# Patient Record
Sex: Female | Born: 1970 | Race: Black or African American | Hispanic: No | Marital: Single | Smoking: Former smoker
Health system: Southern US, Community
[De-identification: ages and names within clinical notes are randomized; demographics above are authoritative.]

## PROBLEM LIST (undated history)

## (undated) DIAGNOSIS — D649 Anemia, unspecified: Secondary | ICD-10-CM

## (undated) DIAGNOSIS — E039 Hypothyroidism, unspecified: Secondary | ICD-10-CM

## (undated) HISTORY — PX: FOOT SURGERY: SHX648

---

## 2017-09-01 ENCOUNTER — Emergency Department (HOSPITAL_COMMUNITY)
Admission: EM | Admit: 2017-09-01 | Discharge: 2017-09-02 | Disposition: A | Discharge status: Home / Self Care | Payer: Self-pay | Attending: Emergency Medicine | Admitting: Emergency Medicine

## 2017-09-01 ENCOUNTER — Emergency Department (HOSPITAL_COMMUNITY): Payer: Self-pay

## 2017-09-01 ENCOUNTER — Encounter (HOSPITAL_COMMUNITY): Payer: Self-pay | Admitting: Nurse Practitioner

## 2017-09-01 DIAGNOSIS — W19XXXA Unspecified fall, initial encounter: Secondary | ICD-10-CM

## 2017-09-01 DIAGNOSIS — W1839XA Other fall on same level, initial encounter: Secondary | ICD-10-CM | POA: Insufficient documentation

## 2017-09-01 DIAGNOSIS — Y999 Unspecified external cause status: Secondary | ICD-10-CM | POA: Insufficient documentation

## 2017-09-01 DIAGNOSIS — Y92012 Bathroom of single-family (private) house as the place of occurrence of the external cause: Secondary | ICD-10-CM | POA: Insufficient documentation

## 2017-09-01 DIAGNOSIS — M25532 Pain in left wrist: Secondary | ICD-10-CM | POA: Insufficient documentation

## 2017-09-01 DIAGNOSIS — Y9389 Activity, other specified: Secondary | ICD-10-CM | POA: Insufficient documentation

## 2017-09-01 NOTE — ED Provider Notes (Signed)
[facility]-EMERGENCY DEPT Provider Note   CSN: [id number] Arrival date & time: 09/01/17  2138     History   Chief Complaint Chief Complaint  Patient presents with  . Wrist Pain  . Fall    HPI April Hansen is a 47 y.o. female with a hx of no major medical problems presents to the Emergency Department complaining of acute, persistent left wrist pain after fall several hours ago.  Patient reports that she stumbled in the bathroom and fell with an outstretched hand landing on her left wrist.  Patient states that she has had 32 ounces of wine tonight.  She reports significant worsening of pain with movement and palpation.  No treatments prior to arrival.  Nothing seems to make her symptoms better.  Patient denies numbness, tingling, weakness but does endorse decreased range of motion.  Patient is adamant that she did not hit her head or have a loss of consciousness.  She has no pain anywhere else.  Significant other at bedside confirms that there was no loss of consciousness.   The history is provided by the patient, a significant other and medical records. No language interpreter was used.    History reviewed. No pertinent past medical history.  There are no active problems to display for this patient.   History reviewed. No pertinent surgical history.   OB History   None      Home Medications    Prior to Admission medications   Not on File    Family History History reviewed. No pertinent family history.  Social History Social History   Tobacco Use  . Smoking status: Unknown If Ever Smoked  Substance Use Topics  . Alcohol use: Yes  . Drug use: Not Currently     Allergies   Patient has no known allergies.   Review of Systems Review of Systems  Constitutional: Negative for chills and fever.  Gastrointestinal: Negative for nausea and vomiting.  Musculoskeletal: Positive for arthralgias and joint swelling. Negative for back pain, neck  pain and neck stiffness.  Skin: Negative for wound.  Neurological: Negative for numbness.  Hematological: Does not bruise/bleed easily.  Psychiatric/Behavioral: The patient is not nervous/anxious.   All other systems reviewed and are negative.    Physical Exam Updated Vital Signs BP 111/86 (BP Location: Right Arm)   Pulse 88   Temp 97.7 F (36.5 C) (Oral)   Resp 20   LMP 08/09/2017   SpO2 100%   Physical Exam  Constitutional: She appears well-developed and well-nourished. No distress.  HENT:  Head: Normocephalic and atraumatic.  Eyes: Conjunctivae are normal.  Neck: Normal range of motion.  Cardiovascular: Normal rate, regular rhythm and intact distal pulses.  Capillary refill < 3 sec  Pulmonary/Chest: Effort normal and breath sounds normal.  Musculoskeletal: She exhibits tenderness. She exhibits no edema.  Left hand and wrist: Decreased range of motion of the left wrist, full range of motion of all fingers of the left hand.  Strength 4/5 with grip strength.  3/5 with flexion and extension of the wrist due to severe pain.  Tenderness to palpation along the lateral portion of the left wrist.  No tenderness to the snuffbox or thenar eminence.  Mild edema and ecchymosis noted to the left wrist.  Sensation intact to normal touch throughout the entire left upper extremity. Full range of motion without pain of the left elbow and shoulder.  No joint line tenderness of the shoulder or elbow.  Neurological: She is alert.  Coordination normal.  Skin: Skin is warm and dry. She is not diaphoretic.  No tenting of the skin  Psychiatric: She has a normal mood and affect.  Nursing note and vitals reviewed.    ED Treatments / Results   Radiology Dg Wrist Complete Left  Result Date: 09/01/2017 CLINICAL DATA:  48 year old female with fall and left wrist pain. EXAM: LEFT WRIST - COMPLETE 3+ VIEW COMPARISON:  None. FINDINGS: There is no acute fracture or dislocation. The bones are mildly  osteopenic. The soft tissues appear unremarkable. IMPRESSION: Negative. Electronically Signed   By: Elgie Collard M.D.   On: 09/01/2017 22:50    Procedures Procedures (including critical care time)  Medications Ordered in ED Medications - No data to display   Initial Impression / Assessment and Plan / ED Course  I have reviewed the triage vital signs and the nursing notes.  Pertinent labs & imaging results that were available during my care of the patient were reviewed by me and considered in my medical decision making (see chart for details).  Clinical Course as of Sep 02 2326  Wynelle Link Sep 01, 2017  2320 Patient states she does not want any pain medication.   [HM]  2321 Personally evaluated the images.  No obvious fracture.  DG Wrist Complete Left [HM]    Clinical Course User Index [HM] Shuntel Fishburn, Dahlia Client, PA-C    Patient X-Ray negative for obvious fracture or dislocation. Pain managed in ED. Pt advised to follow up with orthopedics if symptoms persist for possibility of missed fracture diagnosis. Patient given brace while in ED, conservative therapy recommended and discussed. Patient will be dc home & is agreeable with above plan.   Final Clinical Impressions(s) / ED Diagnoses   Final diagnoses:  Fall, initial encounter  Left wrist pain    ED Discharge Orders    None       Riccardo Holeman, Boyd Kerbs 09/01/17 2328    Melene Plan, DO 09/03/17 0700

## 2017-09-01 NOTE — ED Triage Notes (Signed)
Pt is c/o left wrist pain/injury which she also reports was from an alcohol induced fall, states she had 32 oz wine tonight. Obvious mild deformity noted, with decreased ROM.

## 2017-09-01 NOTE — Discharge Instructions (Addendum)
1. Medications: alternate naprosyn and tylenol for pain control (do not mix these with alcohol), usual home medications 2. Treatment: rest, ice, elevate and use brace, drink plenty of fluids, gentle stretching 3. Follow Up: Please followup with orthopedics as directed or your PCP in 1 week if no improvement for discussion of your diagnoses and further evaluation after today's visit; if you do not have a primary care doctor use the resource guide provided to find one; Please return to the ER for worsening symptoms or other concerns

## 2018-08-05 DIAGNOSIS — R0781 Pleurodynia: Secondary | ICD-10-CM | POA: Diagnosis not present

## 2018-08-05 DIAGNOSIS — M62838 Other muscle spasm: Secondary | ICD-10-CM | POA: Diagnosis not present

## 2018-08-05 DIAGNOSIS — R071 Chest pain on breathing: Secondary | ICD-10-CM | POA: Diagnosis not present

## 2018-08-06 ENCOUNTER — Ambulatory Visit
Admission: RE | Admit: 2018-08-06 | Discharge: 2018-08-06 | Disposition: A | Discharge status: Home / Self Care | Payer: BLUE CROSS/BLUE SHIELD | Source: Ambulatory Visit | Attending: Family Medicine | Admitting: Family Medicine

## 2018-08-06 ENCOUNTER — Other Ambulatory Visit: Payer: Self-pay | Admitting: Family Medicine

## 2018-08-06 DIAGNOSIS — R071 Chest pain on breathing: Secondary | ICD-10-CM

## 2018-08-06 DIAGNOSIS — R0781 Pleurodynia: Secondary | ICD-10-CM

## 2018-08-06 DIAGNOSIS — R0789 Other chest pain: Secondary | ICD-10-CM | POA: Diagnosis not present

## 2018-08-07 DIAGNOSIS — M62838 Other muscle spasm: Secondary | ICD-10-CM | POA: Diagnosis not present

## 2018-08-07 DIAGNOSIS — R071 Chest pain on breathing: Secondary | ICD-10-CM | POA: Diagnosis not present

## 2018-08-07 DIAGNOSIS — M549 Dorsalgia, unspecified: Secondary | ICD-10-CM | POA: Diagnosis not present

## 2018-08-08 ENCOUNTER — Emergency Department (HOSPITAL_COMMUNITY): Payer: BLUE CROSS/BLUE SHIELD

## 2018-08-08 ENCOUNTER — Other Ambulatory Visit: Payer: Self-pay

## 2018-08-08 ENCOUNTER — Emergency Department (HOSPITAL_COMMUNITY)
Admission: EM | Admit: 2018-08-08 | Discharge: 2018-08-08 | Disposition: A | Discharge status: Home / Self Care | Payer: BLUE CROSS/BLUE SHIELD | Attending: Emergency Medicine | Admitting: Emergency Medicine

## 2018-08-08 ENCOUNTER — Encounter (HOSPITAL_COMMUNITY): Payer: Self-pay

## 2018-08-08 DIAGNOSIS — R079 Chest pain, unspecified: Secondary | ICD-10-CM | POA: Diagnosis not present

## 2018-08-08 DIAGNOSIS — Z79899 Other long term (current) drug therapy: Secondary | ICD-10-CM | POA: Insufficient documentation

## 2018-08-08 DIAGNOSIS — F17210 Nicotine dependence, cigarettes, uncomplicated: Secondary | ICD-10-CM | POA: Insufficient documentation

## 2018-08-08 DIAGNOSIS — R0789 Other chest pain: Secondary | ICD-10-CM

## 2018-08-08 LAB — COMPREHENSIVE METABOLIC PANEL
ALBUMIN: 3.9 g/dL (ref 3.5–5.0)
ALT: 10 U/L (ref 0–44)
ANION GAP: 11 (ref 5–15)
AST: 20 U/L (ref 15–41)
Alkaline Phosphatase: 76 U/L (ref 38–126)
BUN: 7 mg/dL (ref 6–20)
CHLORIDE: 104 mmol/L (ref 98–111)
CO2: 24 mmol/L (ref 22–32)
Calcium: 8.7 mg/dL — ABNORMAL LOW (ref 8.9–10.3)
Creatinine, Ser: 0.65 mg/dL (ref 0.44–1.00)
GFR calc Af Amer: >60 mL/min (ref >60)
GFR calc non Af Amer: >60 mL/min (ref >60)
GLUCOSE: 98 mg/dL (ref 70–99)
POTASSIUM: 3.5 mmol/L (ref 3.5–5.1)
Sodium: 139 mmol/L (ref 135–145)
TOTAL PROTEIN: 7.3 g/dL (ref 6.5–8.1)
Total Bilirubin: 0.7 mg/dL (ref 0.3–1.2)

## 2018-08-08 LAB — I-STAT BETA HCG BLOOD, ED (MC, WL, AP ONLY)

## 2018-08-08 LAB — CBC WITH DIFFERENTIAL/PLATELET
Abs Immature Granulocytes: 0.01 10*3/uL (ref 0.00–0.07)
BASOS ABS: 0.1 10*3/uL (ref 0.0–0.1)
BASOS PCT: 1 %
EOS ABS: 0.1 10*3/uL (ref 0.0–0.5)
EOS PCT: 2 %
HEMATOCRIT: 41.1 % (ref 36.0–46.0)
Hemoglobin: 13 g/dL (ref 12.0–15.0)
IMMATURE GRANULOCYTES: 0 %
LYMPHS ABS: 1.8 10*3/uL (ref 0.7–4.0)
Lymphocytes Relative: 46 %
MCH: 29 pg (ref 26.0–34.0)
MCHC: 31.6 g/dL (ref 30.0–36.0)
MCV: 91.5 fL (ref 80.0–100.0)
Monocytes Absolute: 0.4 10*3/uL (ref 0.1–1.0)
Monocytes Relative: 10 %
NEUTROS PCT: 41 %
Neutro Abs: 1.6 10*3/uL — ABNORMAL LOW (ref 1.7–7.7)
PLATELETS: 305 10*3/uL (ref 150–400)
RBC: 4.49 MIL/uL (ref 3.87–5.11)
RDW: 18.3 % — AB (ref 11.5–15.5)
WBC: 3.9 10*3/uL — AB (ref 4.0–10.5)
nRBC: 0 % (ref 0.0–0.2)

## 2018-08-08 LAB — I-STAT TROPONIN, ED: TROPONIN I, POC: 0 ng/mL (ref 0.00–0.08)

## 2018-08-08 LAB — D-DIMER, QUANTITATIVE (NOT AT ARMC): D DIMER QUANT: 0.32 ug{FEU}/mL (ref 0.00–0.50)

## 2018-08-08 MED ORDER — NAPROXEN 500 MG PO TABS: 500.0000 mg | ORAL_TABLET | Freq: Two times a day (BID) | ORAL | 0 refills | Status: DC

## 2018-08-08 MED ORDER — METHOCARBAMOL 500 MG PO TABS: 500.0000 mg | ORAL_TABLET | Freq: Two times a day (BID) | ORAL | 0 refills | Status: DC

## 2018-08-08 MED ORDER — KETOROLAC TROMETHAMINE 30 MG/ML IJ SOLN
30.0000 mg | Freq: Once | INTRAMUSCULAR | Status: AC
  Administered 2018-08-08: 30 mg via INTRAVENOUS
  Filled 2018-08-08: qty 1

## 2018-08-08 NOTE — ED Notes (Signed)
ED Provider at bedside. 

## 2018-08-08 NOTE — ED Triage Notes (Signed)
Patient states she fell asleep on the couch 2 days ago and when she woke  she had right chest pain that radiates into the right rib cage. Patient states she knows she did not fall off of the couch beause she was still lying on the couch this AM. Patient states it hurts to take a deep breath, laugh, or cough. Patient states she had an x-ray done 2 days ago and was told it was negative.

## 2018-08-08 NOTE — Discharge Instructions (Signed)
Your evaluated today for chest wall pain.  Your work-up was negative in department.  I have given you Robaxin and naproxen.  Please take as prescribed.  Please not drive, operate heavy machinery, make life or that decisions while taking these medications.  Follow up with primary care provider next week if you continue to have symptoms.  Return to the ED for any worsening symptoms.

## 2018-08-08 NOTE — ED Provider Notes (Signed)
[facility]-EMERGENCY DEPT Provider Note   CSN: [id number] Arrival date & time: 08/08/18  0946  History   Chief Complaint Chief Complaint  Patient presents with  . chest wall pain  . Chest Pain    HPI April Hansen is a 48 y.o. female with no significant past medical history who presents for evaluation of chest pain.  Patient states she fell asleep on the couch on her right side and then woke up with severe right chest pain 2 days ago.  Pain is located to right chest, right ribs and wraps around under arm into right lateral chest.  Pain is rated 10/10.  Patient states she is pleuritic chest pain, her pain is worse with coughing, laughing, movement or palpating area.  Patient was seen by PCP 2 days ago and had a negative chest x-ray.  Patient states that her PCP told her she needs a chest CT, however it would take "a longtime to get insurance approval." Was given Flexeril and naproxen, however patient states "my husband looked at the side effects and told me not to take it."  Pain is not exertional in nature. No substernal chest pain, associated lightheadedness, dizziness, diaphoresis, nausea, vomiting.  Pain does not radiate into her arms or jaw.  No pain to neck or bilateral shoulders.  Has been taking "herbs" for her pain. Denies additional aggravating or alleviating factors.  History obtained from patient.  No interpreter was used.     HPI  History reviewed. No pertinent past medical history.  There are no active problems to display for this patient.   Past Surgical History:  Procedure Laterality Date  . CESAREAN SECTION    . FOOT SURGERY       OB History   No obstetric history on file.      Home Medications    Prior to Admission medications   Medication Sig Start Date End Date Taking? Authorizing Provider  naproxen sodium (ALEVE) 220 MG tablet Take 220 mg by mouth 2 (two) times daily as needed (pain).   Yes [provider]    methocarbamol (ROBAXIN) 500 MG tablet Take 1 tablet (500 mg total) by mouth 2 (two) times daily. 08/08/18   Zekiah Coen A, PA-C  naproxen (NAPROSYN) 500 MG tablet Take 1 tablet (500 mg total) by mouth 2 (two) times daily. 08/08/18   Bryndan Bilyk A, PA-C    Family History Family History  Problem Relation Age of Onset  . Cancer Mother     Social History Social History   Tobacco Use  . Smoking status: Current Every Day Smoker    Packs/day: 0.10    Types: Cigarettes  . Smokeless tobacco: Never Used  Substance Use Topics  . Alcohol use: Yes  . Drug use: Not Currently     Allergies   Pineapple   Review of Systems Review of Systems  Constitutional: Negative.   HENT: Negative.   Eyes: Negative.   Respiratory: Negative.   Cardiovascular: Positive for chest pain. Negative for palpitations and leg swelling.  Gastrointestinal: Negative.   Genitourinary: Negative.   Musculoskeletal: Negative.   Skin: Negative.   Neurological: Negative.   All other systems reviewed and are negative.    Physical Exam Updated Vital Signs BP (!) 143/97   Pulse 65   Temp 97.9 F (36.6 C) (Oral)   Resp 16   Ht 5\' 8"  (1.727 m)   Wt 73.5 kg   LMP 02/05/2018   SpO2 97%   BMI 24.63  kg/m   Physical Exam Vitals signs and nursing note reviewed. Exam conducted with a chaperone present.  Constitutional:      General: She is not in acute distress.    Appearance: She is well-developed.  HENT:     Head: Atraumatic.  Eyes:     Pupils: Pupils are equal, round, and reactive to light.  Neck:     Musculoskeletal: Normal range of motion.     Comments: No midline cervical tenderness palpation.  No JVD.  Full range of motion without difficulty.  No neck stiffness or neck rigidity.  No meningismus. Cardiovascular:     Rate and Rhythm: Normal rate.     Heart sounds: Normal heart sounds.     Comments: No carotid bruits. Pulmonary:     Effort: No respiratory distress.     Comments: Clear to  auscultation bilateral without wheeze, rhonchi rales.  No accessory muscle usage.  Able speak in full sentences. Chest:       Comments: Right-sided chest wall tenderness to palpation.  No crepitus, edema.  No obvious deformities or lacerations. Abdominal:     General: There is no distension.     Comments: Soft, nontender without rebound or guarding.  No CVA tenderness.  Normoactive bowel sounds.  Musculoskeletal: Normal range of motion.     Right shoulder: Normal.     Left shoulder: Normal.     Right elbow: Normal.    Cervical back: Normal.     Thoracic back: Normal.     Lumbar back: Normal.       Back:     Comments: No midline thoracic or lumbar tenderness palpation.  Full range of motion, however with pain with twisting or bending to the thoracic spine.  Full range of motion bilateral upper extremities without difficulty.  Skin:    General: Skin is warm and dry.     Comments: No rashes or lesions.  Neurological:     Mental Status: She is alert.     ED Treatments / Results  Labs (all labs ordered are listed, but only abnormal results are displayed) Labs Reviewed  CBC WITH DIFFERENTIAL/PLATELET - Abnormal; Notable for the following components:      Result Value   WBC 3.9 (*)    RDW 18.3 (*)    Neutro Abs 1.6 (*)    All other components within normal limits  COMPREHENSIVE METABOLIC PANEL - Abnormal; Notable for the following components:   Calcium 8.7 (*)    All other components within normal limits  D-DIMER, QUANTITATIVE (NOT AT [facility])  I-STAT TROPONIN, ED  I-STAT BETA HCG BLOOD, ED ([facility], [facility], [facility] ONLY)    EKG None  EKG Interpretation  Date/Time:    Ventricular Rate:   79 PR Interval:    QRS Duration:   QT Interval:    QTC Calculation:   R Axis:     Text Interpretation:  Normal sinus rhythm      Radiology Dg Chest 2 View  Result Date: 08/08/2018 CLINICAL DATA:  Right-sided chest and chest wall pain beginning 2 days ago. EXAM: CHEST - 2 VIEW COMPARISON:   Radiography 2 days ago FINDINGS: Artifact overlies the chest. Heart size is normal. Mediastinal shadows are normal. Incidental azygos fissure. The pulmonary vascularity is normal. The lungs are clear. No bone abnormality. No effusions. IMPRESSION: No active cardiopulmonary disease. Electronically Signed   By: Paulina Fusi M.D.   On: 08/08/2018 10:54    Procedures Procedures (including critical care time)  Medications Ordered  in ED Medications  ketorolac (TORADOL) 30 MG/ML injection 30 mg (30 mg Intravenous Given 08/08/18 1230)     Initial Impression / Assessment and Plan / ED Course  I have reviewed the triage vital signs and the nursing notes.  Pertinent labs & imaging results that were available during my care of the patient were reviewed by me and considered in my medical decision making (see chart for details).  48 year old female presents for evaluation of chest wall pain.  Located to right side of chest.  Afebrile, nonseptic, non-ill-appearing.  Patient was seen by PCP 2 days ago for similar complaints and had negative chest x-ray.  Was given Flexeril and ibuprofen, however patient did not want to start these medications as she thought it may be sedating.  Chest pain not exertional or pleuritic in nature.  No associated lightheadedness, dizziness, hemoptysis, diaphoresis, radiation into arm or jaw, nausea or vomiting.  No history of hypertension, hyperlipidemia, diabetes, family history of early MI.  No lower extremity edema, erythema or warmth.  No tachycardia, tachypnea or hypoxia, no exogenous hormone use, recent surgeries or recent immobilization, low suspicion for PE, PERC, Wells negative.  Heart score 0, CBC with WBC at 3.9, Metabolic panel without renal, liver, electrolyte abnormality, troponin negative, d-dimer negative, hCG negative, chest x-ray without evidence of acute cardiopulmonary pathology, EKG without ischemic changes, normal sinus rhythm.  Given patient has bad pain x2 days do  not feel additional troponin necessary at this time.  Patient states PCP sent her over here for a CT scan.  Discussed with patient and husband in room.  Patient and husband state they do not want CT scan at this time.  Discussed risk versus benefit.  They both voiced understanding and declined additional imaging at this time.  Chest wall tenderness to right side and right lateral chest.  No overlying rashes, erythema, warmth, no induration or fluctuance to suggest infectious pathology or shinges.  No crepitus or deformity.  Will DC home with Robaxin and naproxen.  Low suspicion for ACS, PE, dissection, acute rib fracture, pneumothorax, lung symptoms.  Full range of motion bilateral upper extremities, no midline cervical pathology.  Patient hemodynamically stable and appropriate for DC home at this time.  Patient is to be discharged with recommendation to follow up with PCP in regards to today's hospital visit. Chest pain is not likely of cardiac or pulmonary etiology d/t presentation, PERC negative, VSS, no tracheal deviation, no JVD or new murmur, RRR, breath sounds equal bilaterally, EKG without acute abnormalities, negative troponin, and negative CXR. Pt has been advised to return to the ED if CP becomes exertional, associated with diaphoresis or nausea, radiates to left jaw/arm, worsens or becomes concerning in any way. Pt appears reliable for follow up and is agreeable to discharge.      Final Clinical Impressions(s) / ED Diagnoses   Final diagnoses:  Chest wall pain    ED Discharge Orders         Ordered    methocarbamol (ROBAXIN) 500 MG tablet  2 times daily     08/08/18 1303    naproxen (NAPROSYN) 500 MG tablet  2 times daily     08/08/18 1303           Rukiya Hodgkins A, PA-C 08/08/18 1543    Azalia Bilis, MD 08/08/18 [phone]

## 2018-08-22 ENCOUNTER — Encounter: Payer: Self-pay | Admitting: Obstetrics and Gynecology

## 2018-09-03 ENCOUNTER — Ambulatory Visit: Payer: BLUE CROSS/BLUE SHIELD | Admitting: Obstetrics and Gynecology

## 2019-01-22 ENCOUNTER — Telehealth: Payer: Self-pay | Admitting: General Practice

## 2019-01-22 NOTE — Telephone Encounter (Signed)
Patient is calling to see if Dr. Edilia Bo would take her on as a new patient. Patient was reffered by Phillis Haggis. Please advise Cb- [phone]

## 2019-01-22 NOTE — Telephone Encounter (Signed)
ok 

## 2019-01-23 ENCOUNTER — Other Ambulatory Visit: Payer: Self-pay

## 2019-01-23 NOTE — Telephone Encounter (Signed)
Could you please schedule this patient for new patient appointment to see Dr. Lorelei Pont at her convenience.

## 2019-01-27 ENCOUNTER — Other Ambulatory Visit: Payer: Self-pay

## 2019-01-27 NOTE — Progress Notes (Addendum)
[facility] [address], [address] [place], [place] [address] [phone] [phone]  Date:  01/28/2019   Name:  April ChyleDeatrea Hansen   DOB:  28-Mar-1971   MRN:  [id number]  PCP:  Pearline Cablesopland,  C, MD    Chief Complaint: New Patient (Initial Visit) and Back Pain (2 weeks, sciatica)   History of Present Illness:  April Hansen is a 48 y.o. very pleasant female patient who presents with the following:  "De-trea"  Here today as a new patient to establish care She moved here from [place] about 2 years ago- moved to [place] because she wanted a change  She works at [employer]  She has 3 daughters and a grandson- just turned 416 yo  Her daughters are [phone], [phone], and [phone]- they all live in [place]  She does tend to get migraine headache She had foot surgery bilaterally as a young teen- it sounds like bunions and bunionettes were corrected   Her main concern today is sciatica She has had this several times in the past- first noted maybe 7 years ago Always on the left side  This time she has noted pain for about 2 weeks She does lift on a regular basis at work- nothing unusual recently  She walks on a hard floor at work all day long  Never had any back surgery She did have x-rays of her back a few years ago- ok per her recollection  She has used po steroids and did an epidural steroid injection in the past, as well as tramadol for pain No bowel or bladder incont or saddle anesthesia  She has tried ibuprofen and some herbs that were suggested by her relative FreedomSean, also my pt.  unfortunately did not really help   No recent labs, would like to do today  Last mammo a couple of years ago   She has had migraine headache for several years- since she was a teen  Can occur up to once or twice a week - frequency will vary exposure to bright light may increase frequency  She may get aura some of the time  She may vomit with headache She has photo and phono  sensitivity   Never seen neurology for this issue Never tried a triptan  Has so far just used OTC meds prn   Per pt she has had BTL Patient Active Problem List   Diagnosis Date Noted  . Left sided sciatica 01/28/2019  . Migraine with aura and without status migrainosus, not intractable 01/28/2019    No past medical history on file.  Past Surgical History:  Procedure Laterality Date  . CESAREAN SECTION    . FOOT SURGERY      Social History   Tobacco Use  . Smoking status: Current Every Day Smoker    Packs/day: 0.10    Types: Cigarettes  . Smokeless tobacco: Never Used  Substance Use Topics  . Alcohol use: Yes  . Drug use: Not Currently    Family History  Problem Relation Age of Onset  . Cancer Mother     Allergies  Allergen Reactions  . Pineapple Swelling    Medication list has been reviewed and updated.  Current Outpatient Medications on File Prior to Visit  Medication Sig Dispense Refill  . Biotin w/ Vitamins C & E (HAIR/SKIN/NAILS PO) Take by mouth.    . Calcium Carbonate-Vit D-Min (CALCIUM 1200 PO) Take by mouth.    . Multiple Vitamin (MULTIVITAMIN)  tablet Take 1 tablet by mouth daily.     No current facility-administered medications on file prior to visit.     Review of Systems:  As per HPI- otherwise negative.  No fever or chills No CP or SOB No rash  Physical Examination: Vitals:   01/28/19 1028  BP: 120/76  Pulse: 80  Resp: 16  Temp: (!) 97.1 F (36.2 C)  SpO2: 97%   Vitals:   01/28/19 1028  Weight: 156 lb (70.8 kg)  Height: 5\' 8"  (1.727 m)   Body mass index is 23.72 kg/m. Ideal Body Weight: Weight in (lb) to have BMI = 25: 164.1  GEN: WDWN, NAD, Non-toxic, A & O x 3, normal weight, looks well  HEENT: Atraumatic, Normocephalic. Neck supple. No masses, No LAD.  PEERL, TM wnl bilaterally  Ears and Nose: No external deformity. CV: RRR, No M/G/R. No JVD. No thrill. No extra heart sounds. PULM: CTA B, no wheezes, crackles, rhonchi.  No retractions. No resp. distress. No accessory muscle use. ABD: S, NT, ND EXTR: No c/c/e NEURO Normal gait.  PSYCH: Normally interactive. Conversant. Not depressed or anxious appearing.  Calm demeanor.  normal TL ROM, tenderness in left sciatic notch only Normal BLE strength, rom, reflexes   She does note tingling and pain running down the lateral and posterior left leg   Assessment and Plan:   ICD-10-CM   1. Left sided sciatica  M54.32 predniSONE (DELTASONE) 20 MG tablet    traMADol (ULTRAM) 50 MG tablet  2. Migraine with aura and without status migrainosus, not intractable  G43.109 SUMAtriptan (IMITREX) 100 MG tablet  3. Screening for hyperlipidemia  Z13.220 Lipid panel  4. Screening for diabetes mellitus  Z13.1 Comprehensive metabolic panel    Hemoglobin A1c  5. Screening for deficiency anemia  Z13.0 CBC  6. History of thyroid disease  Z86.39 TSH  7. Screening for breast cancer  Z12.39 MM 3D SCREEN BREAST BILATERAL   Screening labs as above Discussed back films, she prefers to defer for now  Ordered mammo------------------------------------------- It was great to meet you today- I will be in touch with your labs For your back, let's try prednisone (oral steroid) for 10 days While on prednisone avoid NSAID meds like ibuprofen, tylenol is ok  I also gave you tramadol to use for uncontrolled pain- use this only if necessary as it is a mild narcotic  For migraine, try Imitrex at the first sign of a headache. Please let me know how this works for you I ordered a screening mammo that can be done on the first floor imaging dept at your convenience  Take care and keep me posted    Follow-up: No follow-ups on file.  Meds ordered this encounter  Medications  . predniSONE (DELTASONE) 20 MG tablet    Sig: Take 2 pills daily for 5 days, then 1 pill daily for 5 days    Dispense:  15 tablet    Refill:  0  . SUMAtriptan (IMITREX) 100 MG tablet    Sig: Take 1/2 or 1 tablet po prn  migraine. May repeat in 2 hours if headache persists or recurs. Max 200 mg in 24 hours    Dispense:  10 tablet    Refill:  0  . traMADol (ULTRAM) 50 MG tablet    Sig: Take 1 tablet (50 mg total) by mouth every 8 (eight) hours as needed for up to 5 days.    Dispense:  15 tablet    Refill:  0  Orders Placed This Encounter  Procedures  . MM 3D SCREEN BREAST BILATERAL  . CBC  . Comprehensive metabolic panel  . Hemoglobin A1c  . Lipid panel  . TSH    @SIGN @    Signed Abbe AmsterdamJessica , MD  Received her labs, letter to pt  Results for orders placed or performed in visit on 01/28/19  CBC  Result Value Ref Range   WBC 5.7 4.0 - 10.5 K/uL   RBC 3.67 (L) 3.87 - 5.11 Mil/uL   Platelets 216.0 150.0 - 400.0 K/uL   Hemoglobin 12.0 12.0 - 15.0 g/dL   HCT 10.235.6 (L) 72.536.0 - 36.646.0 %   MCV 97.1 78.0 - 100.0 fl   MCHC 33.5 30.0 - 36.0 g/dL   RDW 44.016.4 (H) 34.711.5 - 42.515.5 %  Comprehensive metabolic panel  Result Value Ref Range   Sodium 140 135 - 145 mEq/L   Potassium 3.2 (L) 3.5 - 5.1 mEq/L   Chloride 96 96 - 112 mEq/L   CO2 35 (H) 19 - 32 mEq/L   Glucose, Bld 87 70 - 99 mg/dL   BUN 6 6 - 23 mg/dL   Creatinine, Ser 9.560.68 0.40 - 1.20 mg/dL   Total Bilirubin 0.3 0.2 - 1.2 mg/dL   Alkaline Phosphatase 52 39 - 117 U/L   AST 18 0 - 37 U/L   ALT 12 0 - 35 U/L   Total Protein 6.8 6.0 - 8.3 g/dL   Albumin 4.1 3.5 - 5.2 g/dL   Calcium 9.8 8.4 - 38.710.5 mg/dL   GFR 564.33112.69 >29.51>60.00 mL/min  Hemoglobin A1c  Result Value Ref Range   Hgb A1c MFr Bld 5.1 4.6 - 6.5 %  Lipid panel  Result Value Ref Range   Cholesterol 169 0 - 200 mg/dL   Triglycerides 884.1136.0 0.0 - 149.0 mg/dL   HDL 66.0669.60 >30.16>39.00 mg/dL   VLDL 01.027.2 0.0 - 93.240.0 mg/dL   LDL Cholesterol 72 0 - 99 mg/dL   Total CHOL/HDL Ratio 2    NonHDL 98.91   TSH  Result Value Ref Range   TSH 0.32 (L) 0.35 - 4.50 uIU/mL

## 2019-01-27 NOTE — Patient Instructions (Addendum)
It was great to meet you today- I will be in touch with your labs For your back, let's try prednisone (oral steroid) for 10 days While on prednisone avoid NSAID meds like ibuprofen, tylenol is ok  I also gave you tramadol to use for uncontrolled pain- use this only if necessary as it is a mild narcotic  For migraine, try Imitrex at the first sign of a headache. Please let me know how this works for you I ordered a screening mammo that can be done on the first floor imaging dept at your convenience  Take care and keep me posted

## 2019-01-28 ENCOUNTER — Ambulatory Visit (INDEPENDENT_AMBULATORY_CARE_PROVIDER_SITE_OTHER): Payer: BC Managed Care – PPO | Admitting: Family Medicine

## 2019-01-28 ENCOUNTER — Encounter: Payer: Self-pay | Admitting: Family Medicine

## 2019-01-28 VITALS — BP 120/76 | HR 80 | Temp 97.1°F | Resp 16 | Ht 68.0 in | Wt 156.0 lb

## 2019-01-28 DIAGNOSIS — Z13220 Encounter for screening for lipoid disorders: Secondary | ICD-10-CM

## 2019-01-28 DIAGNOSIS — Z131 Encounter for screening for diabetes mellitus: Secondary | ICD-10-CM

## 2019-01-28 DIAGNOSIS — Z8639 Personal history of other endocrine, nutritional and metabolic disease: Secondary | ICD-10-CM

## 2019-01-28 DIAGNOSIS — Z1239 Encounter for other screening for malignant neoplasm of breast: Secondary | ICD-10-CM

## 2019-01-28 DIAGNOSIS — M5432 Sciatica, left side: Secondary | ICD-10-CM | POA: Diagnosis not present

## 2019-01-28 DIAGNOSIS — R7989 Other specified abnormal findings of blood chemistry: Secondary | ICD-10-CM

## 2019-01-28 DIAGNOSIS — Z130 Encounter for screening for diseases of the blood and blood-forming organs and certain disorders involving the immune mechanism: Secondary | ICD-10-CM

## 2019-01-28 DIAGNOSIS — G43109 Migraine with aura, not intractable, without status migrainosus: Secondary | ICD-10-CM

## 2019-01-28 LAB — LIPID PANEL
Cholesterol: 169 mg/dL (ref 0–200)
HDL: 69.6 mg/dL (ref >39.00)
LDL Cholesterol: 72 mg/dL (ref 0–99)
NonHDL: 98.91
Total CHOL/HDL Ratio: 2
Triglycerides: 136 mg/dL (ref 0.0–149.0)
VLDL: 27.2 mg/dL (ref 0.0–40.0)

## 2019-01-28 LAB — COMPREHENSIVE METABOLIC PANEL
ALT: 12 U/L (ref 0–35)
AST: 18 U/L (ref 0–37)
Albumin: 4.1 g/dL (ref 3.5–5.2)
Alkaline Phosphatase: 52 U/L (ref 39–117)
BUN: 6 mg/dL (ref 6–23)
CO2: 35 mEq/L — ABNORMAL HIGH (ref 19–32)
Calcium: 9.8 mg/dL (ref 8.4–10.5)
Chloride: 96 mEq/L (ref 96–112)
Creatinine, Ser: 0.68 mg/dL (ref 0.40–1.20)
GFR: 112.69 mL/min (ref >60.00)
Glucose, Bld: 87 mg/dL (ref 70–99)
Potassium: 3.2 mEq/L — ABNORMAL LOW (ref 3.5–5.1)
Sodium: 140 mEq/L (ref 135–145)
Total Bilirubin: 0.3 mg/dL (ref 0.2–1.2)
Total Protein: 6.8 g/dL (ref 6.0–8.3)

## 2019-01-28 LAB — CBC
HCT: 35.6 % — ABNORMAL LOW (ref 36.0–46.0)
Hemoglobin: 12 g/dL (ref 12.0–15.0)
MCHC: 33.5 g/dL (ref 30.0–36.0)
MCV: 97.1 fl (ref 78.0–100.0)
Platelets: 216 10*3/uL (ref 150.0–400.0)
RBC: 3.67 Mil/uL — ABNORMAL LOW (ref 3.87–5.11)
RDW: 16.4 % — ABNORMAL HIGH (ref 11.5–15.5)
WBC: 5.7 10*3/uL (ref 4.0–10.5)

## 2019-01-28 LAB — HEMOGLOBIN A1C: Hgb A1c MFr Bld: 5.1 % (ref 4.6–6.5)

## 2019-01-28 LAB — TSH: TSH: 0.32 u[IU]/mL — ABNORMAL LOW (ref 0.35–4.50)

## 2019-01-28 MED ORDER — PREDNISONE 20 MG PO TABS: ORAL_TABLET | ORAL | 0 refills | Status: DC

## 2019-01-28 MED ORDER — TRAMADOL HCL 50 MG PO TABS: 50.0000 mg | ORAL_TABLET | Freq: Three times a day (TID) | ORAL | 0 refills | Status: AC | PRN

## 2019-01-28 MED ORDER — SUMATRIPTAN SUCCINATE 100 MG PO TABS: ORAL_TABLET | ORAL | 0 refills | Status: DC

## 2019-01-28 NOTE — Addendum Note (Signed)
Addended by: Lamar Blinks C on: 01/28/2019 06:41 PM   Modules accepted: Orders

## 2019-03-26 ENCOUNTER — Ambulatory Visit (HOSPITAL_BASED_OUTPATIENT_CLINIC_OR_DEPARTMENT_OTHER)
Admission: RE | Admit: 2019-03-26 | Discharge: 2019-03-26 | Disposition: A | Discharge status: Home / Self Care | Payer: BC Managed Care – PPO | Source: Ambulatory Visit | Attending: Family Medicine | Admitting: Family Medicine

## 2019-03-26 ENCOUNTER — Other Ambulatory Visit: Payer: Self-pay

## 2019-03-26 DIAGNOSIS — Z1231 Encounter for screening mammogram for malignant neoplasm of breast: Secondary | ICD-10-CM | POA: Diagnosis not present

## 2019-03-26 DIAGNOSIS — Z1239 Encounter for other screening for malignant neoplasm of breast: Secondary | ICD-10-CM

## 2019-04-29 ENCOUNTER — Emergency Department (HOSPITAL_COMMUNITY): Payer: BC Managed Care – PPO

## 2019-04-29 ENCOUNTER — Encounter (HOSPITAL_COMMUNITY): Payer: Self-pay

## 2019-04-29 ENCOUNTER — Emergency Department (HOSPITAL_COMMUNITY)
Admission: EM | Admit: 2019-04-29 | Discharge: 2019-04-29 | Disposition: A | Discharge status: Home / Self Care | Payer: BC Managed Care – PPO | Attending: Emergency Medicine | Admitting: Emergency Medicine

## 2019-04-29 ENCOUNTER — Other Ambulatory Visit: Payer: Self-pay

## 2019-04-29 DIAGNOSIS — W01198A Fall on same level from slipping, tripping and stumbling with subsequent striking against other object, initial encounter: Secondary | ICD-10-CM | POA: Diagnosis not present

## 2019-04-29 DIAGNOSIS — S0181XA Laceration without foreign body of other part of head, initial encounter: Secondary | ICD-10-CM | POA: Diagnosis not present

## 2019-04-29 DIAGNOSIS — E039 Hypothyroidism, unspecified: Secondary | ICD-10-CM | POA: Insufficient documentation

## 2019-04-29 DIAGNOSIS — R5381 Other malaise: Secondary | ICD-10-CM | POA: Diagnosis not present

## 2019-04-29 DIAGNOSIS — Z23 Encounter for immunization: Secondary | ICD-10-CM | POA: Insufficient documentation

## 2019-04-29 DIAGNOSIS — Y939 Activity, unspecified: Secondary | ICD-10-CM | POA: Diagnosis not present

## 2019-04-29 DIAGNOSIS — W19XXXA Unspecified fall, initial encounter: Secondary | ICD-10-CM

## 2019-04-29 DIAGNOSIS — S0993XA Unspecified injury of face, initial encounter: Secondary | ICD-10-CM | POA: Diagnosis not present

## 2019-04-29 DIAGNOSIS — S0990XA Unspecified injury of head, initial encounter: Secondary | ICD-10-CM | POA: Insufficient documentation

## 2019-04-29 DIAGNOSIS — Y999 Unspecified external cause status: Secondary | ICD-10-CM | POA: Diagnosis not present

## 2019-04-29 DIAGNOSIS — Y92019 Unspecified place in single-family (private) house as the place of occurrence of the external cause: Secondary | ICD-10-CM | POA: Diagnosis not present

## 2019-04-29 DIAGNOSIS — F17210 Nicotine dependence, cigarettes, uncomplicated: Secondary | ICD-10-CM | POA: Insufficient documentation

## 2019-04-29 HISTORY — DX: Anemia, unspecified: D64.9

## 2019-04-29 HISTORY — DX: Hypothyroidism, unspecified: E03.9

## 2019-04-29 MED ORDER — TETANUS-DIPHTH-ACELL PERTUSSIS 5-2.5-18.5 LF-MCG/0.5 IM SUSP
0.5000 mL | Freq: Once | INTRAMUSCULAR | Status: AC
  Administered 2019-04-29: 0.5 mL via INTRAMUSCULAR
  Filled 2019-04-29: qty 0.5

## 2019-04-29 MED ORDER — LIDOCAINE-EPINEPHRINE (PF) 2 %-1:200000 IJ SOLN
10.0000 mL | Freq: Once | INTRAMUSCULAR | Status: AC
  Administered 2019-04-29: 10 mL
  Filled 2019-04-29: qty 10

## 2019-04-29 NOTE — ED Provider Notes (Signed)
..  Laceration Repair  Date/Time: 04/29/2019 11:44 PM Performed by: Marianna Payment, MD Authorized by: Valarie Merino, MD   Consent:    Consent obtained:  Verbal and written   Consent given by:  Patient   Risks discussed:  Infection, pain and poor cosmetic result   Alternatives discussed:  No treatment and delayed treatment Anesthesia (see MAR for exact dosages):    Anesthesia method:  Local infiltration   Local anesthetic:  Lidocaine 2% WITH epi Laceration details:    Location:  Face   Face location:  Chin   Length (cm):  4.5 Pre-procedure details:    Preparation:  Patient was prepped and draped in usual sterile fashion Exploration:    Hemostasis achieved with:  Epinephrine   Wound exploration: wound explored through full range of motion and entire depth of wound probed and visualized     Contaminated: no   Treatment:    Area cleansed with:  Betadine   Amount of cleaning:  Standard   Irrigation solution:  Sterile saline   Irrigation volume:  10 cc   Irrigation method:  Syringe   Visualized foreign bodies/material removed: no   Mucous membrane repair:    Suture size:  5-0   Suture material:  Vicryl   Suture technique:  Simple interrupted Skin repair:    Repair method:  Sutures   Suture size:  5-0   Suture material:  Prolene   Suture technique:  Running Approximation:    Approximation:  Close Post-procedure details:    Dressing:  Non-adherent dressing   Patient tolerance of procedure:  Tolerated well, no immediate complications      Marianna Payment, MD 04/29/19 2348    Valarie Merino, MD 04/29/19 [phone]

## 2019-04-29 NOTE — ED Triage Notes (Addendum)
Patient coming from home with complaints of a fall. Patient was taking shots tonight and states that she lost her balance and hit her face on the counter. Patient believes that she lost consciousness. Denies being on blood thinners. Patient has a laceration on her chin that is still currently bleeding. Patient denies neck and back pain currently.

## 2019-04-29 NOTE — ED Notes (Signed)
Pressure dressing applied to chin.

## 2019-04-29 NOTE — ED Notes (Signed)
Patient ambulated to restroom with no assistance and steady gait. Patient has ride waiting to take her home.

## 2019-04-29 NOTE — Discharge Instructions (Signed)
Please return for any problem.  Follow-up with your regular care provider as instructed.  Sutures should be removed in 5-7 days.

## 2019-04-29 NOTE — ED Provider Notes (Signed)
[facility]-EMERGENCY DEPT Provider Note   CSN: [id number] Arrival date & time: 04/29/19  2056     History   Chief Complaint Chief Complaint  Patient presents with  . Fall  . Facial Laceration    HPI April Hansen is a 48 y.o. female.     48 year old female with prior medical history as detailed below presents for evaluation following fall.  She reports that she had had several alcoholic drinks earlier this evening.  The fall occurred while she was in the bathroom.  She lost her balance and struck her chin against the counter.  She thinks she may have knocked himself out.  She denies any injury except for a laceration to the left lower chin.  She denies dental pain.  She denies neck pain.  She denies other injury.  She is unsure of her last and a shot.    The history is provided by the patient and medical records.  Fall This is a new problem. The current episode started 1 to 2 hours ago. The problem occurs constantly. The problem has not changed since onset.Pertinent negatives include no chest pain, no abdominal pain, no headaches and no shortness of breath. Nothing aggravates the symptoms. Nothing relieves the symptoms.    Past Medical History:  Diagnosis Date  . Anemia   . Hypothyroidism     There are no active problems to display for this patient.   History reviewed. No pertinent surgical history.   OB History    Gravida  5   Para  3   Term      Preterm      AB  2   Living  3     SAB      TAB      Ectopic      Multiple      Live Births               Home Medications    Prior to Admission medications   Not on File    Family History Family History  Problem Relation Age of Onset  . Diabetes Mother   . Cancer Mother     Social History Social History   Tobacco Use  . Smoking status: Current Every Day Smoker    Packs/day: 0.50    Years: 10.00    Pack years: 5.00    Types: Cigarettes  . Smokeless tobacco:  Never Used  Substance Use Topics  . Alcohol use: Yes  . Drug use: Never     Allergies   Adhesive [tape] and Penicillins   Review of Systems Review of Systems  Respiratory: Negative for shortness of breath.   Cardiovascular: Negative for chest pain.  Gastrointestinal: Negative for abdominal pain.  Neurological: Negative for headaches.  All other systems reviewed and are negative.    Physical Exam Updated Vital Signs BP 115/81 (BP Location: Right Arm)   Pulse 76   Temp 98.2 F (36.8 C) (Oral)   Resp 16   Ht 5\' 8"  (1.727 m)   Wt 63.5 kg   SpO2 100%   BMI 21.29 kg/m   Physical Exam Vitals signs and nursing note reviewed.  Constitutional:      General: She is not in acute distress.    Appearance: Normal appearance. She is well-developed.  HENT:     Head: Normocephalic.     Comments: Approximately 3 cm laceration to the left lower aspect of the chin.  No active bleeding noted.  No  acute deformity noted of jaw.   No dental trauma noted.  Eyes:     Conjunctiva/sclera: Conjunctivae normal.     Pupils: Pupils are equal, round, and reactive to light.  Neck:     Musculoskeletal: Normal range of motion and neck supple.  Cardiovascular:     Rate and Rhythm: Normal rate and regular rhythm.     Heart sounds: Normal heart sounds.  Pulmonary:     Effort: Pulmonary effort is normal. No respiratory distress.     Breath sounds: Normal breath sounds.  Abdominal:     General: There is no distension.     Palpations: Abdomen is soft.     Tenderness: There is no abdominal tenderness.  Musculoskeletal: Normal range of motion.        General: No deformity.  Skin:    General: Skin is warm and dry.  Neurological:     Mental Status: She is alert and oriented to person, place, and time.      ED Treatments / Results  Labs (all labs ordered are listed, but only abnormal results are displayed) Labs Reviewed - No data to display  EKG None  Radiology Ct Head Wo Contrast   Result Date: 04/29/2019 CLINICAL DATA:  Head trauma secondary to a fall. The patient struck her face on a counter. EXAM: CT HEAD WITHOUT CONTRAST TECHNIQUE: Contiguous axial images were obtained from the base of the skull through the vertex without intravenous contrast. COMPARISON:  None. FINDINGS: Brain: No evidence of acute infarction, hemorrhage, hydrocephalus, extra-axial collection or mass lesion/mass effect. Vascular: No hyperdense vessel or unexpected calcification. Skull: Normal. Negative for fracture or focal lesion. Sinuses/Orbits: No acute abnormality. Old healed fracture of the medial wall of the right orbit. Other: None IMPRESSION: No significant abnormalities. Electronically Signed   By: Lorriane Shire M.D.   On: 04/29/2019 21:31    Procedures Procedures (including critical care time)  Medications Ordered in ED Medications  Tdap (BOOSTRIX) injection 0.5 mL (0.5 mLs Intramuscular Given 04/29/19 2147)  lidocaine-EPINEPHrine (XYLOCAINE W/EPI) 2 %-1:200000 (PF) injection 10 mL (10 mLs Infiltration Given by Other 04/29/19 2148)     Initial Impression / Assessment and Plan / ED Course  I have reviewed the triage vital signs and the nursing notes.  Pertinent labs & imaging results that were available during my care of the patient were reviewed by me and considered in my medical decision making (see chart for details).        MDM  Screen complete  [name] was evaluated in Emergency Department on 04/29/2019 for the symptoms described in the history of present illness. She was evaluated in the context of the global COVID-19 pandemic, which necessitated consideration that the patient might be at risk for infection with the SARS-CoV-2 virus that causes COVID-19. Institutional protocols and algorithms that pertain to the evaluation of patients at risk for COVID-19 are in a state of rapid change based on information released by regulatory bodies including the CDC and federal and  state organizations. These policies and algorithms were followed during the patient's care in the ED.  Patient is presenting for evaluation and treatment following fall.  She has a linear laceration to the left side of her chin.  Imaging studies did not reveal other intracranial injury.  She is otherwise without injury on exam.   Laceration repaired in the ED.  Patient tolerated this well.  Patient is appropriate for discharge.  Importance of close follow-up is stressed.  Strict return precautions given  and understood.  Final Clinical Impressions(s) / ED Diagnoses   Final diagnoses:  Fall, initial encounter  Chin laceration, initial encounter    ED Discharge Orders    None       Wynetta Fines, MD 04/29/19 2223

## 2019-04-30 ENCOUNTER — Encounter: Payer: Self-pay | Admitting: Family Medicine

## 2019-06-16 NOTE — Progress Notes (Signed)
[facility] [address], [address] [place], [place] [address] [phone]  Date:  06/18/2019   Name:  April Hansen   DOB:  Oct 29, 1970   MRN:  [id number]  PCP:  Pearline Cables, MD    Chief Complaint: Suture / Staple Removal   History of Present Illness:  April Hansen is a 49 y.o. very pleasant female patient who presents with the following:  Here today with concern of suture removal Last seen by myself in August She recently moved from [place] to the local area, works at [employer].  On 11/18 she had some sutures placed in her chin per the emergency department- she did not know that she was upposed to have these removed  Per ER note, it appears that she had absorbable sutures placed inside her mouth, but also nonabsorbable on the external skin She has some visible blue suture material on her external chin, her partner was worried about this so she came in to be seen She otherwise feels okay, no other concerns today  Patient Active Problem List   Diagnosis Date Noted  . Left sided sciatica 01/28/2019  . Migraine with aura and without status migrainosus, not intractable 01/28/2019    Past Medical History:  Diagnosis Date  . Anemia   . Hypothyroidism     Past Surgical History:  Procedure Laterality Date  . CESAREAN SECTION    . FOOT SURGERY      Social History   Tobacco Use  . Smoking status: Current Every Day Smoker    Packs/day: 0.50    Years: 10.00    Pack years: 5.00    Types: Cigarettes  . Smokeless tobacco: Never Used  Substance Use Topics  . Alcohol use: Yes  . Drug use: Never    Family History  Problem Relation Age of Onset  . Diabetes Mother   . Cancer Mother     Allergies  Allergen Reactions  . Adhesive [Tape] Itching  . Penicillins Itching  . Pineapple Swelling    Medication list has been reviewed and updated.  Current Outpatient Medications on File Prior to Visit   Medication Sig Dispense Refill  . Biotin w/ Vitamins C & E (HAIR/SKIN/NAILS PO) Take by mouth.    . Calcium Carbonate-Vit D-Min (CALCIUM 1200 PO) Take by mouth.    . Multiple Vitamin (MULTIVITAMIN) tablet Take 1 tablet by mouth daily.    . SUMAtriptan (IMITREX) 100 MG tablet Take 1/2 or 1 tablet po prn migraine. May repeat in 2 hours if headache persists or recurs. Max 200 mg in 24 hours 10 tablet 0   No current facility-administered medications on file prior to visit.    Review of Systems:  As per HPI- otherwise negative.   Physical Examination: Vitals:   06/18/19 0853  BP: 112/60  Pulse: 90  Resp: 16  Temp: (!) 96.4 F (35.8 C)  SpO2: 98%   Vitals:   06/18/19 0853  Weight: 158 lb (71.7 kg)  Height: 5\' 8"  (1.727 m)   Body mass index is 24.02 kg/m. Ideal Body Weight: Weight in (lb) to have BMI = 25: 164.1   GEN: WDWN, NAD, Non-toxic, Alert & Oriented x 3 HEENT: Atraumatic, Normocephalic.  Ears and Nose: No external deformity. EXTR: No clubbing/cyanosis/edema NEURO: Normal gait.  PSYCH: Normally interactive. Conversant. Not depressed or anxious appearing.  Calm demeanor.  Fairly long, well-healed laceration to the left aspect of her chin.  There are couple  of visible Prolene suture ends.  These were gently pulled out using forceps.  I am not able to visualize any other suture to remove  Assessment and Plan: Visit for suture removal  Removed sutures from chin laceration which patient sustained over 6 weeks ago.  I advised her that there may be suture material under the skin which I am not able to see.  It is also possible that her other sutures fell out on their own.  At this point, I would advise observation.  If she has irritation or discomfort from the laceration, or if more suture material becomes visible, please seek care  This visit occurred during the SARS-CoV-2 public health emergency.  Safety protocols were in place, including screening questions prior to the  visit, additional usage of staff PPE, and extensive cleaning of exam room while observing appropriate contact time as indicated for disinfecting solutions.    Signed Lamar Blinks, MD

## 2019-06-18 ENCOUNTER — Other Ambulatory Visit: Payer: Self-pay

## 2019-06-18 ENCOUNTER — Encounter: Payer: Self-pay | Admitting: Family Medicine

## 2019-06-18 ENCOUNTER — Ambulatory Visit (INDEPENDENT_AMBULATORY_CARE_PROVIDER_SITE_OTHER): Payer: BC Managed Care – PPO | Admitting: Family Medicine

## 2019-06-18 VITALS — BP 112/60 | HR 90 | Temp 96.4°F | Resp 16 | Ht 68.0 in | Wt 158.0 lb

## 2019-06-18 DIAGNOSIS — Z4802 Encounter for removal of sutures: Secondary | ICD-10-CM

## 2020-01-23 NOTE — Progress Notes (Deleted)
[facility] [address] April Hansen, April Hansen   DOB:  02-28-1971   MRN:  [id number]  PCP:  Pearline Cables, MD    Chief Complaint: No chief complaint on file.   History of Present Illness:  April Hansen is a 49 y.o. very pleasant female patient who presents with the following:  Patient with history of migraine headache and hypothyroidism, here today for physical exam  Last seen by myself in January for suture removal following an ER visit for chin laceration Patient moved to this area from [place] about 3 years ago.  She has 3 daughters who live in [place] and 1 grandson  COVID-19 series Pap Mammogram up-to-date Colon cancer screening Labs-1 year ago, update today   Patient Active Problem List   Diagnosis Date Noted  . Left sided sciatica 01/28/2019  . Migraine with aura and without status migrainosus, not intractable 01/28/2019    Past Medical History:  Diagnosis Date  . Anemia   . Hypothyroidism     Past Surgical History:  Procedure Laterality Date  . CESAREAN SECTION    . FOOT SURGERY      Social History   Tobacco Use  . Smoking status: Current Every Day Smoker    Packs/day: 0.50    Years: 10.00    Pack years: 5.00    Types: Cigarettes  . Smokeless tobacco: Never Used  Vaping Use  . Vaping Use: Never used  Substance Use Topics  . Alcohol use: Yes  . Drug use: Never    Family History  Problem Relation Age of Onset  . Diabetes Mother   . Cancer Mother     Allergies  Allergen Reactions  . Adhesive [Tape] Itching  . Penicillins Itching  . Pineapple Swelling    Medication list has been reviewed and updated.  Current Outpatient Medications on File Prior to Visit  Medication Sig Dispense Refill  . Biotin w/ Vitamins C & E (HAIR/SKIN/NAILS PO) Take by mouth.    . Calcium Carbonate-Vit D-Min (CALCIUM 1200 PO)  Take by mouth.    . Multiple Vitamin (MULTIVITAMIN) tablet Take 1 tablet by mouth daily.    . SUMAtriptan (IMITREX) 100 MG tablet Take 1/2 or 1 tablet po prn migraine. May repeat in 2 hours if headache persists or recurs. Max 200 mg in 24 hours 10 tablet 0   No current facility-administered medications on file prior to visit.    Review of Systems:  As per HPI- otherwise negative.   Physical Examination: There were no vitals filed for this visit. There were no vitals filed for this visit. There is no height or weight on file to calculate BMI. Ideal Body Weight:    GEN: no acute distress. HEENT: Atraumatic, Normocephalic.  Ears and Nose: No external deformity. CV: RRR, No M/G/R. No JVD. No thrill. No extra heart sounds. PULM: CTA B, no wheezes, crackles, rhonchi. No retractions. No resp. distress. No accessory muscle use. ABD: S, NT, ND, +BS. No rebound. No HSM. EXTR: No c/c/e PSYCH: Normally interactive. Conversant.    Assessment and Plan: *** This visit occurred during the SARS-CoV-2 public health emergency.  Safety protocols were in place, including screening questions prior to the visit, additional usage of staff PPE, and extensive cleaning of exam room while observing appropriate contact time as indicated for disinfecting solutions.    Signed Shanda Bumps Real Cona,  MD

## 2020-01-25 ENCOUNTER — Encounter: Payer: BC Managed Care – PPO | Admitting: Family Medicine

## 2021-03-16 ENCOUNTER — Other Ambulatory Visit: Payer: Self-pay

## 2021-03-16 ENCOUNTER — Encounter: Payer: Self-pay | Admitting: Internal Medicine

## 2021-03-16 ENCOUNTER — Ambulatory Visit (INDEPENDENT_AMBULATORY_CARE_PROVIDER_SITE_OTHER): Payer: Self-pay | Admitting: Internal Medicine

## 2021-03-16 VITALS — BP 122/92 | HR 82 | Temp 98.3°F | Resp 16 | Ht 68.0 in | Wt 143.4 lb

## 2021-03-16 DIAGNOSIS — K529 Noninfective gastroenteritis and colitis, unspecified: Secondary | ICD-10-CM

## 2021-03-16 MED ORDER — PANTOPRAZOLE SODIUM 40 MG PO TBEC: 40.0000 mg | DELAYED_RELEASE_TABLET | Freq: Every day | ORAL | 0 refills | Status: DC

## 2021-03-16 MED ORDER — ONDANSETRON HCL 8 MG PO TABS: 8.0000 mg | ORAL_TABLET | Freq: Three times a day (TID) | ORAL | 0 refills | Status: DC | PRN

## 2021-03-16 NOTE — Progress Notes (Signed)
Subjective:    Patient ID: April Hansen, female    DOB: 1970-08-31, 50 y.o.   MRN: [id number]  DOS:  03/16/2021 Type of visit - description: acute  Symptoms a started 3 days ago: Diarrhea, 3-4 episodes a day, stools are loose and watery sometimes, no blood, occasionally dark but not tarry. She also has epigastric abdominal pain described as a dull ache, worse when she tries to eat or drink. Also nausea and vomiting, typically postprandial, no hematemesis. She has been at home for 3 days, today for the first time she was able to keep down some soup and crackers otherwise has not been able to drink much.   Review of Systems  Had a low-grade fever at first and still has some chills. Denies any cough or respiratory symptoms No recent NSAIDs No recent antibiotics Denies any sick contacts Has not eaten any unusual foods. History of migraines, no recent migraines. No dysuria, gross hematuria. Slightly dizzy when she stands up. LMP 3 years ago, no recent spotting or problems.  Past Medical History:  Diagnosis Date   Anemia    Hypothyroidism     Past Surgical History:  Procedure Laterality Date   CESAREAN SECTION     FOOT SURGERY      Allergies as of 03/16/2021       Reactions   Adhesive [tape] Itching   Penicillins Itching   Pineapple Swelling        Medication List        Accurate as of March 16, 2021 11:59 PM. If you have any questions, ask your nurse or doctor.          CALCIUM 1200 PO Take by mouth.   HAIR/SKIN/NAILS PO Take by mouth.   multivitamin tablet Take 1 tablet by mouth daily.   ondansetron 8 MG tablet Commonly known as: Zofran Take 1 tablet (8 mg total) by mouth every 8 (eight) hours as needed for nausea or vomiting. Started by: Willow Ora, MD   pantoprazole 40 MG tablet Commonly known as: PROTONIX Take 1 tablet (40 mg total) by mouth daily. Started by: Willow Ora, MD   SUMAtriptan 100 MG tablet Commonly known as: Imitrex Take 1/2  or 1 tablet po prn migraine. May repeat in 2 hours if headache persists or recurs. Max 200 mg in 24 hours           Objective:   Physical Exam BP (!) 122/92 (BP Location: Left Arm, Patient Position: Sitting, Cuff Size: Small)   Pulse 82   Temp 98.3 F (36.8 C) (Oral)   Resp 16   Ht 5\' 8"  (1.727 m)   Wt 143 lb 6 oz (65 kg)   SpO2 98%   BMI 21.80 kg/m  General:   Well developed, NAD, BMI noted.  HEENT:  Normocephalic . Face symmetric, atraumatic. Conjunctiva eyes: Not pale, not icteric. Oral membranes: Not particularly dry Lungs:  CTA B Normal respiratory effort, no intercostal retractions, no accessory muscle use. Heart: RRR,  no murmur.  Abdomen:  Not distended, soft, slightly tender at the epigastric area without mass or rebound.  No obvious organomegaly.  Bowel sounds moderately increased. Skin: Not pale. Not jaundice Lower extremities: no pretibial edema bilaterally  Neurologic:  alert & oriented X3.  Speech normal, gait appropriate for age and unassisted Psych--  Cognition and judgment appear intact.  Cooperative with normal attention span and concentration.  Behavior appropriate. No anxious or depressed appearing.     Assessment  50 year old female, PMH includes migraines, menopausal by history, presents with  Gastroenteritis, acute: Sx c/w acute gastroenteritis.  COVID test negative today. Orthostatic vital signs: BP okay, she got a slightly tachycardic when she stood up thus she is mild to moderately dehydrated but nontoxic-appearing. She could benefit from IV fluids but we don't have that capability @ the office.. Plan: Zofran, push fluids including Gatorade, pantoprazole temporarily, OTC Imodium. 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  This visit occurred during the SARS-CoV-2 public health emergency.  Safety protocols were in place,  including screening questions prior to the visit, additional usage of staff PPE, and extensive cleaning of exam room while observing appropriate contact time as indicated for disinfecting solutions.

## 2021-03-16 NOTE — Patient Instructions (Signed)
Rest at home  Do not go back to work until you are completely well  Drink plenty of fluids: Water, Gatorade, 7-Up.  Start taking soups and crackers.  Pantoprazole is an acid reducer, take 1 a day for the next couple of weeks  Start taking Zofran as needed for nausea  Okay to take Imodium as needed for diarrhea  ER if: Severe symptoms, unable to keep fluids in the next 24 hours, severe stomach pain, blood in the stools, not getting better in the next 2 to 3 days.   Viral Gastroenteritis, Adult Viral gastroenteritis is also known as the stomach flu. This condition may affect your stomach, your small intestine, and your large intestine. It can cause sudden watery poop (diarrhea), fever, and throwing up (vomiting). This condition is caused by certain germs (viruses). These germs can be passed from person to person very easily (are contagious). Having watery poop and throwing up can make you feel weak and cause you to not have enough water in your body (get dehydrated). This can make you tired and thirsty, make you have a dry mouth, and make it so you pee (urinate) less often. It is important to replace the fluids that you lose from having watery poop and throwing up. What are the causes? You can get sick by catching viruses from other people. You can also get sick by: Eating food, drinking water, or touching a surface that has the viruses on it (is contaminated). Sharing utensils or other personal items with a person who is sick. What increases the risk? Having a weak body defense system (immune system). Living with one or more children who are younger than 87 years old. Living in a nursing home. Going on cruise ships. What are the signs or symptoms? Symptoms of this condition start suddenly. Symptoms may last for a few days or for as long as a week. Common symptoms include: Watery poop. Throwing up. Other symptoms include: Fever. Headache. Feeling tired (fatigue). Pain in the belly  (abdomen). Chills. Feeling weak. Feeling sick to your stomach (nauseous). Muscle aches. Not feeling hungry. How is this treated? This condition typically goes away on its own. The focus of treatment is to replace the fluids that you lose. This condition may be treated with: An ORS (oral rehydration solution). This is a drink that is sold at pharmacies and stores. Medicines to help with your symptoms. Probiotic supplements to reduce symptoms of diarrhea. Fluids given through an IV tube, if needed. Older adults and people with other diseases or a weak body defense system are at higher risk for not having enough water in the body. Follow these instructions at home: Eating and drinking  Take an ORS as told by your doctor. Drink clear fluids in small amounts as you are able. Clear fluids include: Water. Ice chips. Fruit juice with water added to it (diluted). Low-calorie sports drinks. Drink enough fluid to keep your pee (urine) pale yellow. Eat small amounts of healthy foods every 3-4 hours as you are able. This may include whole grains, fruits, vegetables, lean meats, and yogurt. Avoid fluids that have a lot of sugar or caffeine in them, such as energy drinks, sports drinks, and soda. Avoid spicy or fatty foods. Avoid alcohol. General instructions  Wash your hands often. This is very important after you have watery poop or you throw up. If you cannot use soap and water, use hand sanitizer. Make sure that all people in your home wash their hands well and often.  Take over-the-counter and prescription medicines only as told by your doctor. Rest at home while you get better. Watch your condition for any changes. Take a warm bath to help with any burning or pain from having watery poop. Keep all follow-up visits as told by your doctor. This is important. Contact a doctor if: You cannot keep fluids down. Your symptoms get worse. You have new symptoms. You feel light-headed. You feel  dizzy. You have muscle cramps. Get help right away if: You have chest pain. You feel very weak. You pass out (faint). You see blood in your throw-up. Your throw-up looks like coffee grounds. You have bloody or black poop (stools) or poop that looks like tar. You have a very bad headache, or a stiff neck, or both. You have a rash. You have very bad pain, cramping, or bloating in your belly. You have trouble breathing. You are breathing very quickly. You have a fast heartbeat. Your skin feels cold and clammy. You feel mixed up (confused). You have pain when you pee. You have signs of not having enough water in the body, such as: Dark pee, hardly any pee, or no pee. Cracked lips. Dry mouth. Sunken eyes. Feeling very sleepy. Feeling weak. Summary Viral gastroenteritis is also known as the stomach flu. This condition can cause sudden watery poop (diarrhea), fever, and throwing up (vomiting). These germs can be passed from person to person very easily. Take an ORS as told by your doctor. This is a drink that is sold at pharmacies and stores. Drink fluids in small amounts many times each day as you are able. This information is not intended to replace advice given to you by your health care provider. Make sure you discuss any questions you have with your health care provider. Document Revised: 04/02/2018 Document Reviewed: 04/02/2018 Elsevier Patient Education  2022 [employer].

## 2021-04-03 ENCOUNTER — Telehealth: Payer: Self-pay

## 2021-04-03 NOTE — Telephone Encounter (Signed)
Would you like to see if she can come in for the 10am slot? Can this wait for Wednesday? Or recommend seeing another provider?

## 2021-04-03 NOTE — Telephone Encounter (Signed)
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 It was healing, but then she got sick 2 weeks ago. The scar started itching, but then it blistered back up. She is allergic to adhesives. The bandaid made her break out in a rash/blister. It's spread down her arm that has turned into whelps. Translation No Nurse Assessment Nurse: Langston Masker, RN, Amber Date/Time (Eastern Time): 03/31/2021 4:53:32 PM Confirm and document reason for call. If symptomatic, describe symptoms. ---Caller states she burned herself 4 weeks ago. It was healing, but then she got sick 2 weeks ago. The scar started itching, but then it blistered back up. She is allergic to adhesives. The band-aid made her break out in a rash/blister. It's spread down her right arm that has turned into whelps. States between elbow and wrist on right side, 2 inches around the burn area. Does the patient have any new or worsening symptoms? ---Yes Will a triage be completed? ---Yes Related visit to physician within the last 2 weeks? ---No Does the PT have any chronic conditions? (i.e. diabetes, asthma, this includes High risk factors for pregnancy, etc.) ---Yes List chronic conditions. ---hyperthyroid Is the patient pregnant or possibly pregnant? (Ask all females between the ages of 56-55) ---No Call Id: [id number] Guidelines Guideline Title Affirmed Question Affirmed Notes Nurse Date/Time Lamount Cohen Time)Burns - Thermal [1] Blister (intact orruptured) AND [2]larger than 2 inches(5 cm) Morris, Charity fundraiser, [facility] 03/31/2021 4:55:24PM Disp.  Time Lamount Cohen Time) Disposition Final User 03/31/2021 4:57:35 PM Go to ED Now Yes Morris, RN, Biochemist, clinical Disagree/Comply Disagree Caller Understands Yes PreDisposition Home Care Care Advice Given Per Guideline GO TO ED NOW: * You need to be seen in the Emergency Department. * Leave now. Drive carefully. COVER BURN: * Cover with sterile dressing or clean washcloth or towel. CARE ADVICE given per Lawerance Bach - Thermal (Adult) guideline. Referrals GO TO FACILITY UNDECIDED GO TO FACILITY REFUSED

## 2021-04-04 NOTE — Progress Notes (Signed)
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 It was healing, but then she got sick 2 weeks ago. The scar started itching, and then it blistered up. She is allergic to adhesives. The bandaid that she was using made her break out in a rash/blister. ) and left ear pain (Started about 3-4 days ago. All noises hurt to her. )   History of Present Illness:  Trey Call is a 50 y.o. very pleasant female patient who presents with the following:  Patient with history of migraine headache and hypothyroidism.  She contacted me earlier this week with concern of a burn injury-phone notes as follows caller states she burned herself 4 weeks ago. It was healing, but then she got sick 2 weeks ago. The scar started itching, but then it blistered back up. She is allergic to adhesives. The band-aid made her break out in a rash/blister. It's spread down her right arm that has turned into whelps. States between elbow and wrist on right side, 2 inches around the burn area.  Patient contacted [place] on 10/24, offered to see her that day but she was not able to come in so we scheduled this visit  She notes that she burned her right arm about 4 weeks ago on an oven rack while at work.  It seemed to be healing but then she got sick about 3 weeks ago with sx of vomiting and diarrhea.  She lost about 8 lbs in a few days, had a fever, was very tired.  While she was ill with her stomach symptoms, it seems like her burn also got worse.  She notes it became more tender and was weeping Her GI symptoms are currently resolved.  Her burn seems to be doing better, but it is still tender especially when touched.  Also, she notes her left  ear has been tender for the last couple of days   Last visit with myself in January 2021 for suture removal  Most recent labs on chart from 2020 Tetanus is up-to-date Patient Active Problem List   Diagnosis Date Noted   Left sided sciatica 01/28/2019   Migraine with aura and without status migrainosus, not intractable 01/28/2019    Past Medical History:  Diagnosis Date   Anemia    Hypothyroidism     Past Surgical History:  Procedure Laterality Date   CESAREAN SECTION     FOOT SURGERY      Social History   Tobacco Use   Smoking status: Every Day    Packs/day: 0.50    Years: 10.00    Pack years: 5.00    Types: Cigarettes   Smokeless tobacco: Never  Vaping Use   Vaping Use: Never used  Substance Use Topics   Alcohol use: Yes   Drug use: Never    Family History  Problem Relation Age of Onset   Diabetes Mother    Cancer Mother     Allergies  Allergen Reactions   Adhesive [Tape] Itching   Penicillins Itching   Pineapple Swelling    Medication list has been reviewed and updated.  Current Outpatient Medications on File Prior to Visit  Medication Sig Dispense Refill   ondansetron (ZOFRAN) 8 MG tablet Take 1 tablet (8 mg total) by mouth every 8 (eight) hours as needed for nausea or vomiting. 20 tablet 0   pantoprazole (PROTONIX) 40 MG tablet Take 1 tablet (40 mg total) by mouth daily. 30 tablet 0   SUMAtriptan (IMITREX) 100 MG tablet Take 1/2 or 1 tablet po prn migraine. May repeat in 2 hours if headache persists or recurs. Max 200 mg in 24 hours 10 tablet 0   No current facility-administered medications on file prior to visit.    Review of Systems:  As per HPI- otherwise negative.  Patient states no possibility of current pregnancy Physical Examination: Vitals:   04/05/21 1441 04/05/21 1455  BP: 124/80   Pulse: (!) 110 90  Resp: 18   Temp: 97.9 F (36.6 C)   SpO2: 97%    Vitals:   04/05/21 1441  Weight: 148 lb (67.1 kg)  Height: 5\' 8"  (1.727  m)   Body mass index is 22.5 kg/m. Ideal Body Weight: Weight in (lb) to have BMI = 25: 164.1  GEN: no acute distress.  Normal weight, looks well HEENT: Atraumatic, Normocephalic.  Bilateral TM wnl, oropharynx normal.  PEERL,EOMI.   There is some erythema and tenderness of the left external ear canal, but no swelling or exudate Ears and Nose: No external deformity. CV: RRR, No M/G/R. No JVD. No thrill. No extra heart sounds. PULM: CTA B, no wheezes, crackles, rhonchi. No retractions. No resp. distress. No accessory muscle use. EXTR: No c/c/e PSYCH: Normally interactive. Conversant.  Healing burn on ventral aspect of right forearm.  Approximately 4 x 2 cm in size Skin is now closed, but remains shiny and pink  Assessment and Plan: Left ear pain - Plan: doxycycline (VIBRAMYCIN) 100 MG capsule  Partial thickness burn of right forearm, initial encounter - Plan: doxycycline (VIBRAMYCIN) 100 MG capsule  Patient seen today to follow-up on her burn and also with concern of ear pain.  Ear exam seems most consistent with mild cellulitis of the external canal.  We will treat her with doxycycline for 10 days due to penicillin allergy Reassured that her burn seems to be healing well, but due to significance of burn I am not surprised she still has hypersensitivity.  She will let me know if this is not gradually improving  Signed , MD

## 2021-04-05 ENCOUNTER — Other Ambulatory Visit: Payer: Self-pay

## 2021-04-05 ENCOUNTER — Ambulatory Visit (INDEPENDENT_AMBULATORY_CARE_PROVIDER_SITE_OTHER): Payer: Self-pay | Admitting: Family Medicine

## 2021-04-05 VITALS — BP 124/80 | HR 90 | Temp 97.9°F | Resp 18 | Ht 68.0 in | Wt 148.0 lb

## 2021-04-05 DIAGNOSIS — H9202 Otalgia, left ear: Secondary | ICD-10-CM

## 2021-04-05 DIAGNOSIS — T22211A Burn of second degree of right forearm, initial encounter: Secondary | ICD-10-CM

## 2021-04-05 MED ORDER — DOXYCYCLINE HYCLATE 100 MG PO CAPS: 100.0000 mg | ORAL_CAPSULE | Freq: Two times a day (BID) | ORAL | 0 refills | Status: DC

## 2021-04-05 NOTE — Patient Instructions (Signed)
It was good to see you again today- I am sorry that you got burned!  Use the doxycycline antibiotic for your ear  Please let me know if you are not getting back to normal in the next several days-Sooner if worse.

## 2021-04-20 IMAGING — MG DIGITAL SCREENING BILAT W/ TOMO W/ CAD
8 series · 9 of 24 positions shown · non-contrast
Comparison: Previous exam(s).

CLINICAL DATA: Screening.

EXAM:
DIGITAL SCREENING BILATERAL MAMMOGRAM WITH TOMO AND CAD

[R MLO synth-2D]
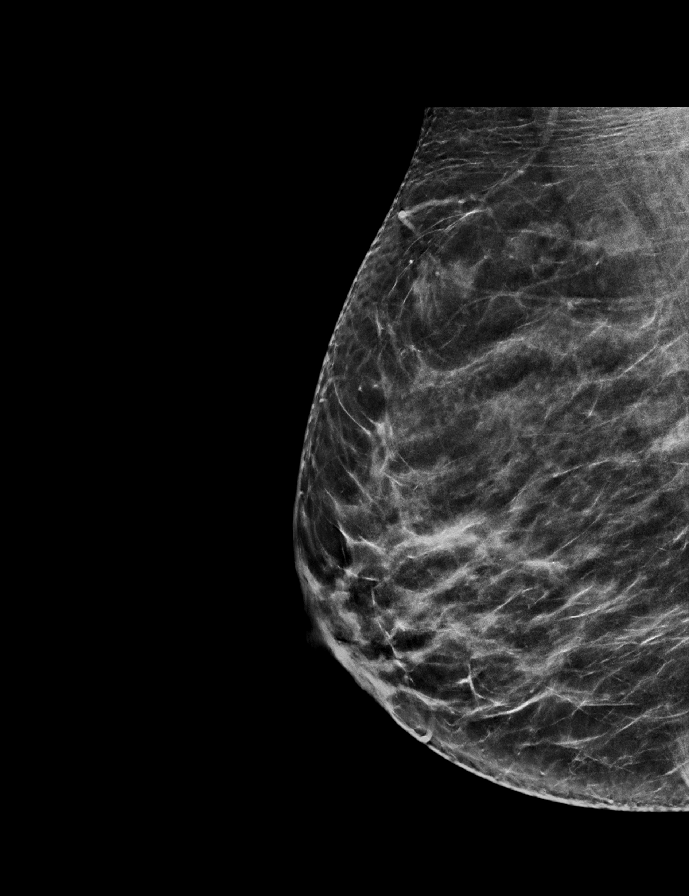

[R CC synth-2D]
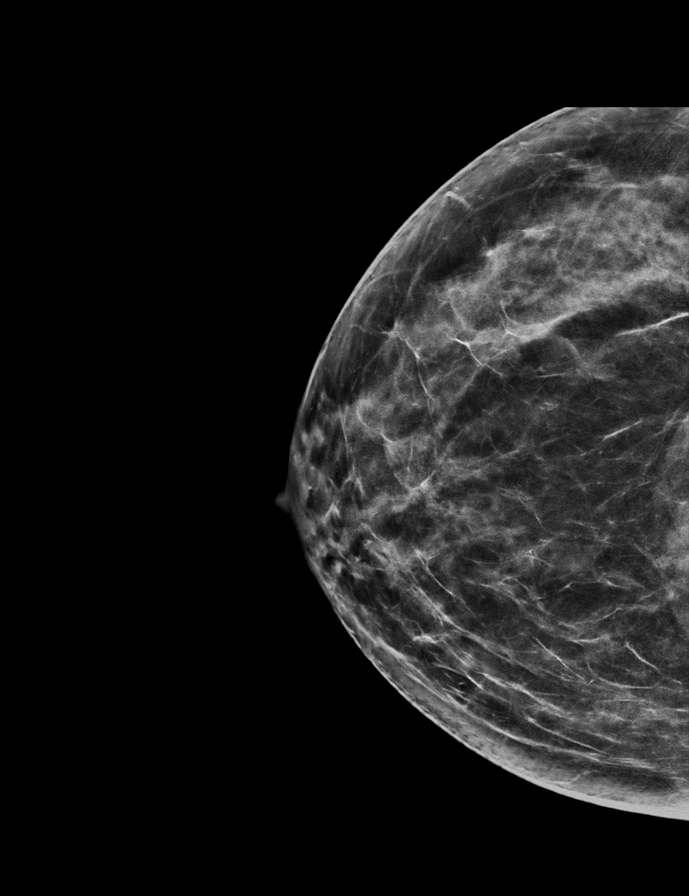

[L CC synth-2D]
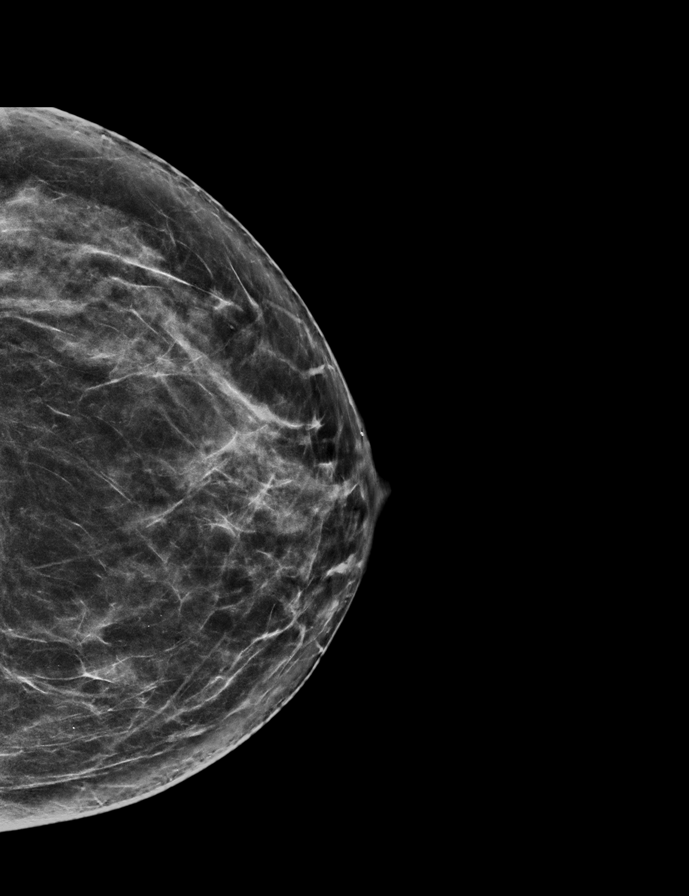

[L MLO synth-2D]
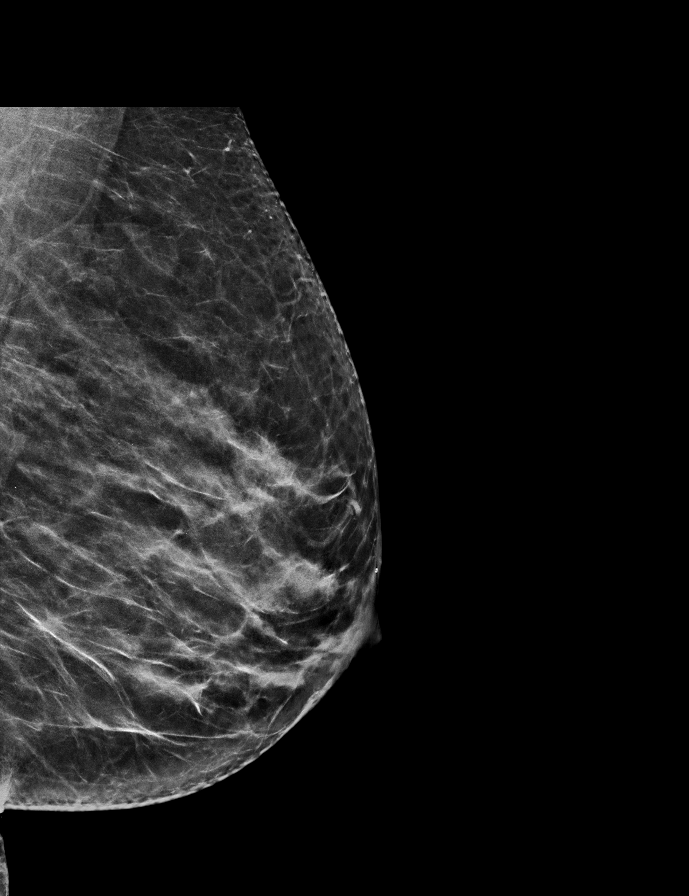

[L CC tomo · 2 of 65 frames shown]
[frame 21/65]
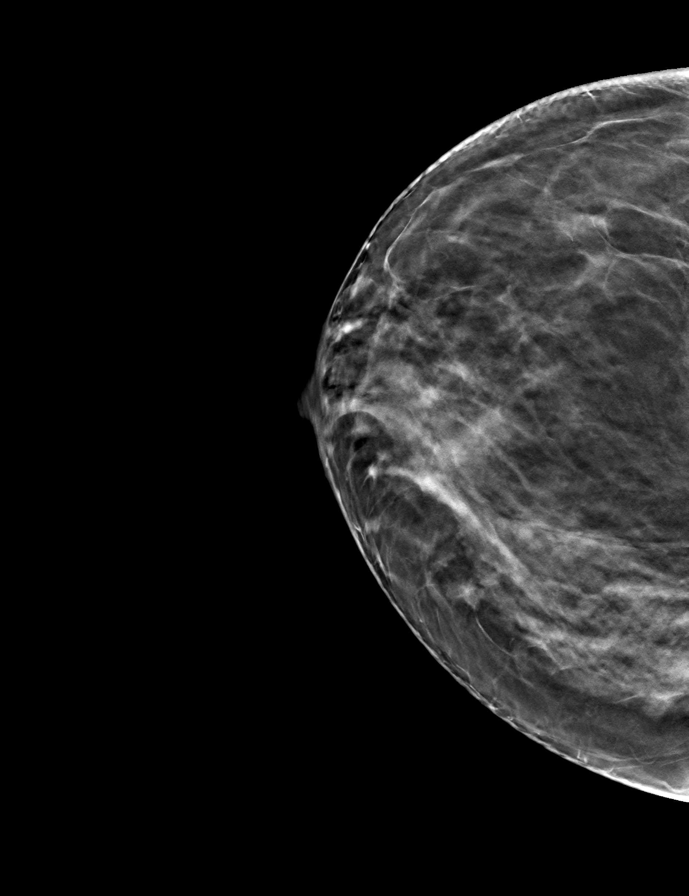
[frame 33/65]
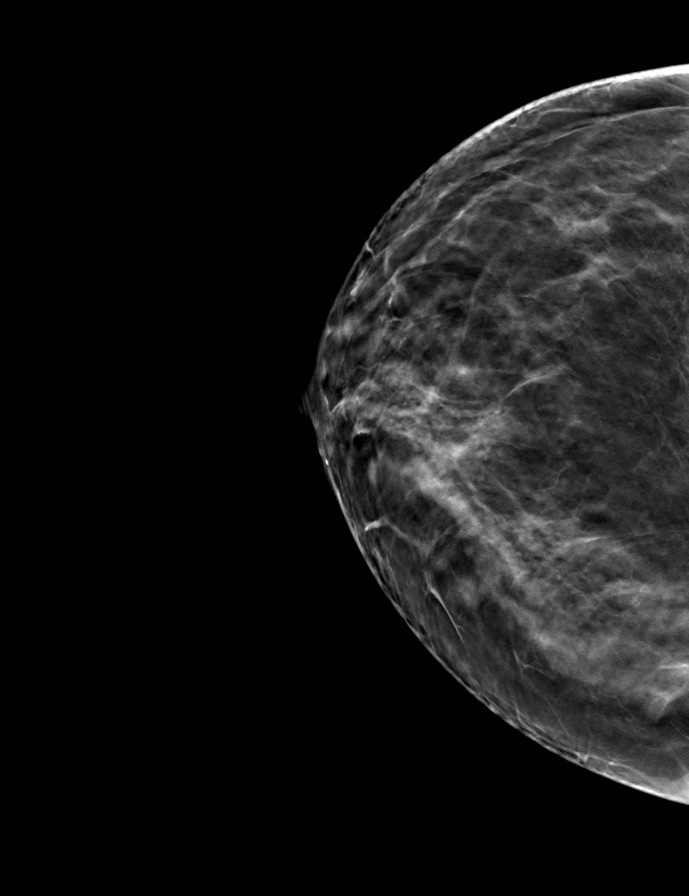

[R CC tomo · tomo slice 32/63.0]
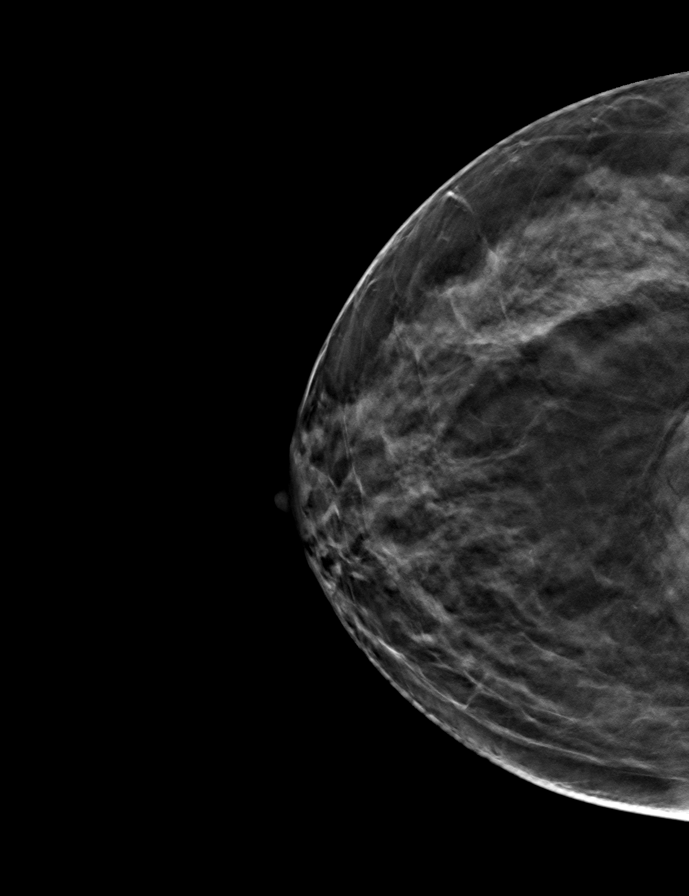

[L MLO tomo · tomo slice 33/66.0]
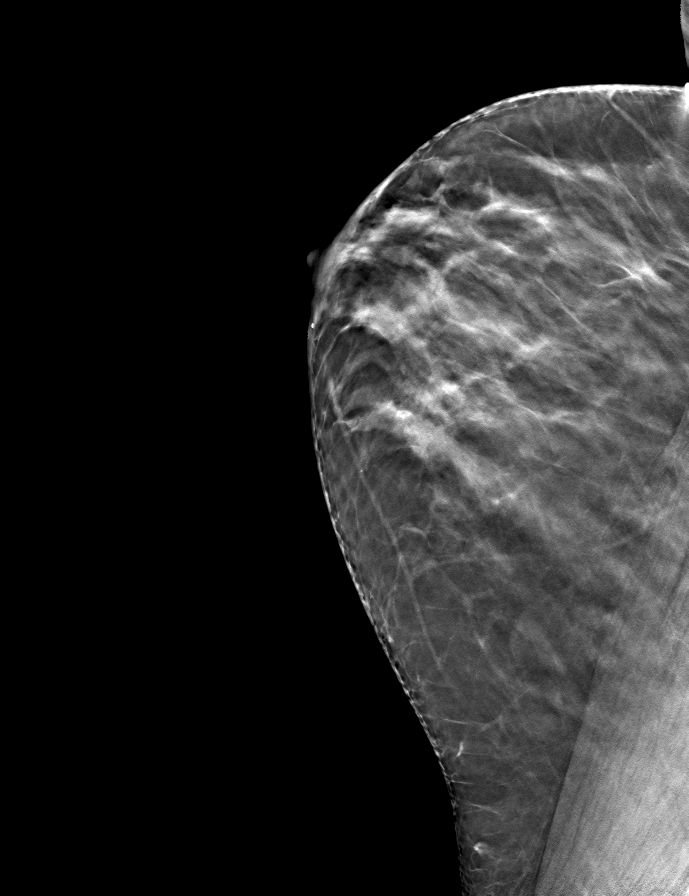

[R MLO tomo · tomo slice 33/65.0]
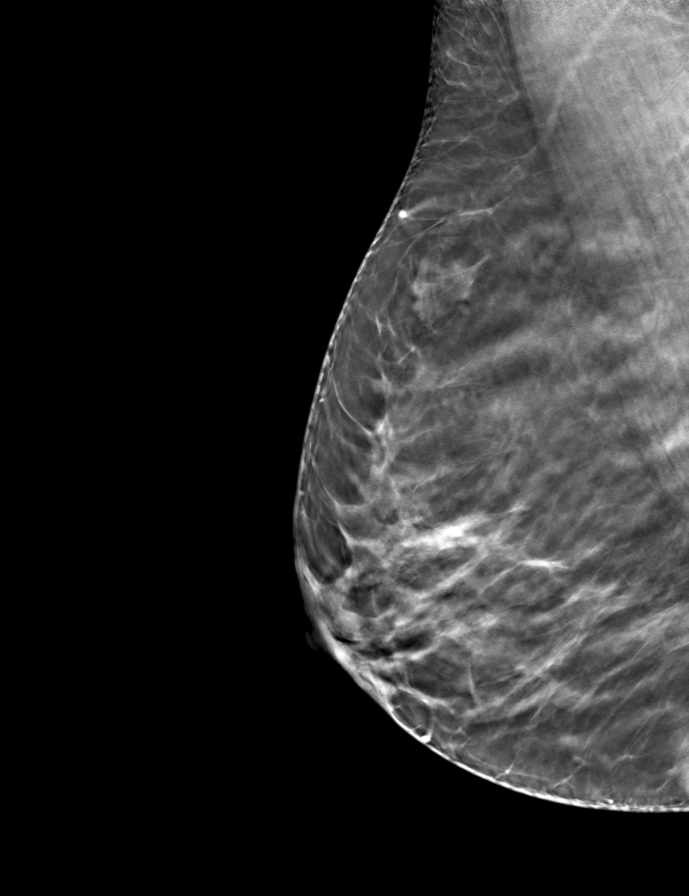

[9 of 24 positions shown; findings below may reference images not displayed]

ACR Breast Density Category c: The breast tissue is heterogeneously
dense, which may obscure small masses.
FINDINGS: There are no findings suspicious for malignancy. Images were
processed with CAD.
IMPRESSION: No mammographic evidence of malignancy. A result letter of this
screening mammogram will be mailed directly to the patient.

RECOMMENDATION:
Screening mammogram in one year. (Code:FT-U-LHB)

BI-RADS CATEGORY  1: Negative.

## 2021-04-26 ENCOUNTER — Encounter: Payer: Self-pay | Admitting: Family Medicine

## 2021-04-26 ENCOUNTER — Telehealth: Payer: Self-pay | Admitting: Family Medicine

## 2021-04-26 NOTE — Telephone Encounter (Signed)
Patient states she fished all of the medicine given to her for her ear pain but medication has run out and she still is in pain. She would like to know if she needs another prescription for the medication or if she needs to try something else. Please advice.

## 2021-04-26 NOTE — Telephone Encounter (Signed)
Please advise 

## 2021-06-20 ENCOUNTER — Other Ambulatory Visit: Payer: Self-pay

## 2021-06-20 ENCOUNTER — Telehealth (INDEPENDENT_AMBULATORY_CARE_PROVIDER_SITE_OTHER): Payer: Self-pay | Admitting: Family Medicine

## 2021-06-20 ENCOUNTER — Encounter: Payer: Self-pay | Admitting: Family Medicine

## 2021-06-20 VITALS — Temp 100.3°F

## 2021-06-20 DIAGNOSIS — R040 Epistaxis: Secondary | ICD-10-CM

## 2021-06-20 DIAGNOSIS — R0981 Nasal congestion: Secondary | ICD-10-CM

## 2021-06-20 DIAGNOSIS — R059 Cough, unspecified: Secondary | ICD-10-CM

## 2021-06-20 MED ORDER — PROMETHAZINE-DM 6.25-15 MG/5ML PO SYRP: 5.0000 mL | ORAL_SOLUTION | Freq: Four times a day (QID) | ORAL | 0 refills | Status: DC | PRN

## 2021-06-20 NOTE — Patient Instructions (Signed)
° ° ° °--------------------------------------------------------------------------------------------------------------------------- ° ° ° °  WORK SLIP:  Patient April Hansen,  06-02-1971, was seen for a medical visit today, 06/20/21 . Please excuse from work for illness. Advise excuse from work until fevers have resolved for at least 24 hours and symptoms are improving.Also advise 2 negative covid tests 24 hours apart before return to work.   Sincerely: E-signature: Dr. Kriste Basque, DO [facility] Ph: [phone]   ------------------------------------------------------------------------------------------------------------------------------     HOME CARE TIPS:  -I sent the medication(s) we discussed to your pharmacy: Meds ordered this encounter  Medications   promethazine-dextromethorphan (PROMETHAZINE-DM) 6.25-15 MG/5ML syrup    Sig: Take 5 mLs by mouth 4 (four) times daily as needed for cough.    Dispense:  118 mL    Refill:  0     -can use tylenol or aleve if needed for fevers, aches and pains per instructions  -can use nasal saline a few times per day if you have nasal congestion; sometimes  a short course of Afrin nasal spray for 3 days can help with symptoms as well   -If you have a nose bleed, pince nose firmly as shown for 10 minutes while leaning head forward. If a lot of bleeding or if bleeding does not stop seek [facility] medical care right away.   -stay hydrated, drink plenty of fluids and eat small healthy meals - avoid dairy  -can take 1000 IU ( ) Vit D3 and 100-500 mg of Vit C daily per instructions   -follow up with your doctor in 2-3 days unless improving and feeling better  -stay home while sick, except to seek medical care. If you have COVID19, you will likely be contagious for 7-10 days. Flu or Influenza is likely contagious for about 7 days. Other respiratory viral infections remain contagious for 5-10+ days depending on the virus  and many other factors. Wear a good mask that fits snugly (such as N95 or KN95) if around others to reduce the risk of transmission.  It was nice to meet you today, and I really hope you are feeling better soon. I help [facility] out with telemedicine visits on Tuesdays and Thursdays and am happy to help if you need a follow up virtual visit on those days. Otherwise, if you have any concerns or questions following this visit please schedule a follow up visit with your Primary Care doctor or seek care at a local urgent care clinic to avoid delays in care.    Seek in person care or schedule a follow up video visit promptly if your symptoms worsen, new concerns arise or you are not improving with treatment. Call 911 and/or seek emergency care if your symptoms are severe or life threatening.

## 2021-06-20 NOTE — Progress Notes (Signed)
Virtual Visit via Video Note  I connected with April Hansen  on 06/20/21 at  3:00 PM EST by a video enabled telemedicine application and verified that I am speaking with the correct person using two identifiers.  Location patient: [place] Location provider:work or home office Persons participating in the virtual visit: patient, provider  I discussed the limitations and requested verbal permission for telemedicine visit. The patient expressed understanding and agreed to proceed.   HPI:  Acute telemedicine visit for cough: -Onset: about 4-5 days ago -has done 2 covid covid test day 2 and day 5 both negative -Symptoms include:laryngitis, lots nasal congestion, cough, fevers, chills, sore throat, has been blowing nose a lot and has had some nose bleeds -no known sick contacts -Denies:NVD, CP, SOB, inability to drink fluids -Has tried: musinex, OTC cold/cough meds -Pertinent past medical history: see below -Pertinent medication allergies: Allergies  Allergen Reactions   Adhesive [Tape] Itching   Penicillins Itching   Pineapple Swelling  -COVID-19 vaccine status:  Immunization History  Administered Date(s) Administered   PFIZER(Purple Top)SARS-COV-2 Vaccination 11/27/2019, 12/18/2019, 06/22/2020   Tdap 04/29/2019     ROS: See pertinent positives and negatives per HPI.  Past Medical History:  Diagnosis Date   Anemia    Hypothyroidism     Past Surgical History:  Procedure Laterality Date   CESAREAN SECTION     FOOT SURGERY       Current Outpatient Medications:    ondansetron (ZOFRAN) 8 MG tablet, Take 1 tablet (8 mg total) by mouth every 8 (eight) hours as needed for nausea or vomiting., Disp: 20 tablet, Rfl: 0   pantoprazole (PROTONIX) 40 MG tablet, Take 1 tablet (40 mg total) by mouth daily. (Patient taking differently: Take 40 mg by mouth as needed.), Disp: 30 tablet, Rfl: 0   promethazine-dextromethorphan (PROMETHAZINE-DM) 6.25-15 MG/5ML syrup, Take 5 mLs by mouth 4 (four)  times daily as needed for cough., Disp: 118 mL, Rfl: 0  EXAM:  VITALS per patient if applicable:  GENERAL: alert, oriented, appears well and in no acute distress  HEENT: atraumatic, conjunttiva clear, no obvious abnormalities on inspection of external nose and ears  NECK: normal movements of the head and neck  LUNGS: on inspection no signs of respiratory distress, breathing rate appears normal, no obvious gross SOB, gasping or wheezing  CV: no obvious cyanosis  MS: moves all visible extremities without noticeable abnormality  PSYCH/NEURO: pleasant and cooperative, no obvious depression or anxiety, speech and thought processing grossly intact  ASSESSMENT AND PLAN:  Discussed the following assessment and plan:  Nasal congestion  Cough, unspecified type  Bleeding from the nose  -we discussed possible serious and likely etiologies, options for evaluation and workup, limitations of telemedicine visit vs in person visit, treatment, treatment risks and precautions. Pt is agreeable to treatment via telemedicine at this moment. Query influenza vs other viral resp illness vs other. She has opted to treat with cough rx and other care measures per patient instructions. Advised of management and precautions for nose bleeds.  Work/School slipped offered: provided in patient instructions   Advised to seek prompt virtual visit or in person care if worsening, new symptoms arise, or if is not improving with treatment as expected per our conversation of expected course. Discussed options for follow up care.    I discussed the assessment and treatment plan with the patient. The patient was provided an opportunity to ask questions and all were answered. The patient agreed with the plan and demonstrated an understanding of  the instructions.     Lucretia Kern, DO

## 2022-11-26 ENCOUNTER — Ambulatory Visit (HOSPITAL_BASED_OUTPATIENT_CLINIC_OR_DEPARTMENT_OTHER)
Admission: RE | Admit: 2022-11-26 | Discharge: 2022-11-26 | Disposition: A | Discharge status: Home / Self Care | Payer: PRIVATE HEALTH INSURANCE | Source: Ambulatory Visit | Attending: Internal Medicine | Admitting: Internal Medicine

## 2022-11-26 ENCOUNTER — Encounter: Payer: Self-pay | Admitting: Internal Medicine

## 2022-11-26 ENCOUNTER — Ambulatory Visit (INDEPENDENT_AMBULATORY_CARE_PROVIDER_SITE_OTHER): Payer: PRIVATE HEALTH INSURANCE | Admitting: Internal Medicine

## 2022-11-26 VITALS — BP 136/80 | HR 98 | Temp 98.0°F | Resp 16 | Ht 68.0 in | Wt 173.5 lb

## 2022-11-26 DIAGNOSIS — M79671 Pain in right foot: Secondary | ICD-10-CM | POA: Diagnosis not present

## 2022-11-26 DIAGNOSIS — M10072 Idiopathic gout, left ankle and foot: Secondary | ICD-10-CM | POA: Diagnosis not present

## 2022-11-26 LAB — COMPREHENSIVE METABOLIC PANEL
ALT: 11 U/L (ref 0–35)
AST: 12 U/L (ref 0–37)
Albumin: 4.6 g/dL (ref 3.5–5.2)
Alkaline Phosphatase: 79 U/L (ref 39–117)
BUN: 7 mg/dL (ref 6–23)
CO2: 31 mEq/L (ref 19–32)
Calcium: 10.3 mg/dL (ref 8.4–10.5)
Chloride: 100 mEq/L (ref 96–112)
Creatinine, Ser: 0.82 mg/dL (ref 0.40–1.20)
GFR: 83.72 mL/min (ref >60.00)
Glucose, Bld: 92 mg/dL (ref 70–99)
Potassium: 4 mEq/L (ref 3.5–5.1)
Sodium: 143 mEq/L (ref 135–145)
Total Bilirubin: 0.6 mg/dL (ref 0.2–1.2)
Total Protein: 7.8 g/dL (ref 6.0–8.3)

## 2022-11-26 LAB — CBC WITH DIFFERENTIAL/PLATELET
Basophils Absolute: 0 10*3/uL (ref 0.0–0.1)
Basophils Relative: 0.8 % (ref 0.0–3.0)
Eosinophils Absolute: 0.1 10*3/uL (ref 0.0–0.7)
Eosinophils Relative: 1.1 % (ref 0.0–5.0)
HCT: 44.6 % (ref 36.0–46.0)
Hemoglobin: 14.3 g/dL (ref 12.0–15.0)
Lymphocytes Relative: 40.7 % (ref 12.0–46.0)
Lymphs Abs: 2.3 10*3/uL (ref 0.7–4.0)
MCHC: 32.1 g/dL (ref 30.0–36.0)
MCV: 95.2 fl (ref 78.0–100.0)
Monocytes Absolute: 0.5 10*3/uL (ref 0.1–1.0)
Monocytes Relative: 8.5 % (ref 3.0–12.0)
Neutro Abs: 2.8 10*3/uL (ref 1.4–7.7)
Neutrophils Relative %: 48.9 % (ref 43.0–77.0)
Platelets: 324 10*3/uL (ref 150.0–400.0)
RBC: 4.68 Mil/uL (ref 3.87–5.11)
RDW: 15.4 % (ref 11.5–15.5)
WBC: 5.7 10*3/uL (ref 4.0–10.5)

## 2022-11-26 LAB — URIC ACID: Uric Acid, Serum: 10.5 mg/dL — ABNORMAL HIGH (ref 2.4–7.0)

## 2022-11-26 MED ORDER — PREDNISONE 10 MG PO TABS: ORAL_TABLET | ORAL | 0 refills | Status: AC

## 2022-11-26 MED ORDER — COLCHICINE 0.6 MG PO TABS: 0.6000 mg | ORAL_TABLET | Freq: Two times a day (BID) | ORAL | 0 refills | Status: AC | PRN

## 2022-11-26 NOTE — Patient Instructions (Addendum)
Proceed to the lab for blood work  Make an appointment to see Dr. Patsy Lager in 2 weeks  At the first floor, get x-rays of your right foot.  I think you have gout.  Recommend to take prednisone as prescribed, see prescription  Icing  IBUPROFEN (Advil or Motrin) 200 mg 2 tablets every 12 hours as needed for pain.  Always take it with food because may cause gastritis and ulcers.  If you notice nausea, stomach pain, change in the color of stools --->  Stop the medicine and let us know  Leg elevation  Call if not gradually better  Call immediately if fever, chills, severe symptoms, getting worse.  Please read information about gout

## 2022-11-26 NOTE — Progress Notes (Unsigned)
   Subjective:    Patient ID: April Hansen, female    DOB: 13-Apr-1971, 52 y.o.   MRN: [id number]  DOS:  11/26/2022 Type of visit - description: acute  Symptoms developed yesterday: Pain and swelling at the right foot. Does not recall any injury. No fever or chills. Pain is intense at times particularly when the area is touched or when she tries to ambulate.   Review of Systems See above   Past Medical History:  Diagnosis Date   Anemia    Hypothyroidism     Past Surgical History:  Procedure Laterality Date   CESAREAN SECTION     FOOT SURGERY      Current Outpatient Medications  Medication Instructions   ondansetron (ZOFRAN) 8 mg, Oral, Every 8 hours PRN   pantoprazole (PROTONIX) 40 mg, Oral, Daily   promethazine-dextromethorphan (PROMETHAZINE-DM) 6.25-15 MG/5ML syrup 5 mLs, Oral, 4 times daily PRN       Objective:   Physical Exam BP 136/80   Pulse 98   Temp 98 F (36.7 C) (Oral)   Resp 16   Ht 5\' 8"  (1.727 m)   Wt 173 lb 8 oz (78.7 kg)   SpO2 96%   BMI 26.38 kg/m  General:   Well developed, NAD, BMI noted. HEENT:  Normocephalic . Face symmetric, atraumatic Lower extremities: Left foot normal. Right foot: Good pedal pulses, + redness-swelling around the base of the right great toe.  The area is exquisitely tender.  No openings, no abscess. See picture Skin: Not pale. Not jaundice Neurologic:  alert & oriented X3.  Speech normal, gait appropriate for age and unassisted Psych--  Cognition and judgment appear intact.  Cooperative with normal attention span and concentration.  Behavior appropriate. No anxious or depressed appearing.      Assessment     52 year old female, PMH includes history of migraines, menopausal, presents with:  Gout:  H/o great toe surgeries bilaterally when she was 52 years old presents with acute onset of right great toe pain consistent with podagra. I explained the patient why I am suspicious of gout, recommend CMP,  uric acid and CBC. Due to history of previous surgery will get x-ray. Prednisone prescription sent, also recommend icing, ibuprofen or Tylenol.  See AVS. Call if not brotherly better or worse Once lab results come back most likely will benefit from colchicine. Educational material provided for the patient Strongly recommend to see PCP in 2 weeks for follow-up.

## 2022-11-27 DIAGNOSIS — M10072 Idiopathic gout, left ankle and foot: Secondary | ICD-10-CM | POA: Insufficient documentation

## 2022-12-11 NOTE — Progress Notes (Deleted)
[facility] [address], [address] [place], [place] [address] [phone]  Date:  12/12/2022   Name:  April Hansen   DOB:  Nov 05, 1970   MRN:  [id number]  PCP:  Pearline Cables, MD    Chief Complaint: No chief complaint on file.   History of Present Illness:  April Hansen is a 52 y.o. very pleasant female patient who presents with the following:  Patient seen today for short-term follow-up History of hypothyroidism, migraine headache She was seen by my partner Dr. Drue Novel on June 17 with concern of foot pain-most recent visit with myself was in 2022  Dr. Drue Novel suspected gout and got blood work, prescribed prednisone and colchicine Uric acid was indeed elevated at 10.5  Colon cancer screening Pap smear Mammogram appears overdue  Patient Active Problem List   Diagnosis Date Noted   Acute idiopathic gout of left foot 11/27/2022   Left sided sciatica 01/28/2019   Migraine with aura and without status migrainosus, not intractable 01/28/2019    Past Medical History:  Diagnosis Date   Anemia    Hypothyroidism     Past Surgical History:  Procedure Laterality Date   CESAREAN SECTION     FOOT SURGERY      Social History   Tobacco Use   Smoking status: Former    Packs/day: 0.50    Years: 10.00    Additional pack years: 0.00    Total pack years: 5.00    Types: Cigarettes    Quit date: 05/2021    Years since quitting: 1.5   Smokeless tobacco: Never  Vaping Use   Vaping Use: Never used  Substance Use Topics   Alcohol use: Yes   Drug use: Never    Family History  Problem Relation Age of Onset   Diabetes Mother    Cancer Mother     Allergies  Allergen Reactions   Adhesive [Tape] Itching   Penicillins Itching   Pineapple Swelling    Medication list has been reviewed and updated.  Current Outpatient Medications on File Prior to Visit  Medication Sig Dispense Refill   colchicine 0.6 MG tablet  Take 1 tablet (0.6 mg total) by mouth 2 (two) times daily as needed. 60 tablet 0   predniSONE (DELTASONE) 10 MG tablet 4 tablets x 2 days, 3 tabs x 2 days, 2 tabs x 2 days, 1 tab x 2 days 20 tablet 0   No current facility-administered medications on file prior to visit.    Review of Systems:  As per HPI- otherwise negative.   Physical Examination: There were no vitals filed for this visit. There were no vitals filed for this visit. There is no height or weight on file to calculate BMI. Ideal Body Weight:    GEN: no acute distress. HEENT: Atraumatic, Normocephalic.  Ears and Nose: No external deformity. CV: RRR, No M/G/R. No JVD. No thrill. No extra heart sounds. PULM: CTA B, no wheezes, crackles, rhonchi. No retractions. No resp. distress. No accessory muscle use. ABD: S, NT, ND, +BS. No rebound. No HSM. EXTR: No c/c/e PSYCH: Normally interactive. Conversant.    Assessment and Plan: ***  Signed Abbe Amsterdam, MD

## 2022-12-12 ENCOUNTER — Ambulatory Visit: Payer: PRIVATE HEALTH INSURANCE | Admitting: Family Medicine

## 2023-04-25 ENCOUNTER — Encounter: Payer: Self-pay | Admitting: Family Medicine
